# Patient Record
Sex: Male | Born: 1935
Health system: Southern US, Community
[De-identification: ages and names within clinical notes are randomized; demographics above are authoritative.]

## PROBLEM LIST (undated history)

## (undated) DIAGNOSIS — K264 Chronic or unspecified duodenal ulcer with hemorrhage: Secondary | ICD-10-CM

## (undated) DIAGNOSIS — K5730 Diverticulosis of large intestine without perforation or abscess without bleeding: Secondary | ICD-10-CM

## (undated) DIAGNOSIS — Z960 Presence of urogenital implants: Secondary | ICD-10-CM

## (undated) DIAGNOSIS — N400 Enlarged prostate without lower urinary tract symptoms: Secondary | ICD-10-CM

## (undated) DIAGNOSIS — N19 Unspecified kidney failure: Secondary | ICD-10-CM

## (undated) DIAGNOSIS — Z9289 Personal history of other medical treatment: Secondary | ICD-10-CM

## (undated) HISTORY — DX: Unspecified kidney failure: N19

## (undated) HISTORY — PX: COLONOSCOPY W/ POLYPECTOMY: SHX1380

## (undated) HISTORY — PX: HERNIA REPAIR: SHX51

## (undated) HISTORY — PX: TONSILLECTOMY: SUR1361

## (undated) HISTORY — DX: Diverticulosis of large intestine without perforation or abscess without bleeding: K57.30

---
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
## 2007-10-18 ENCOUNTER — Encounter: Admission: RE | Admit: 2007-10-18 | Discharge: 2007-10-18 | Payer: Self-pay | Admitting: Surgery

## 2007-10-18 IMAGING — CR DG CHEST 2V
2 series · 2 of 2 positions shown · non-contrast
Comparison: None.

CLINICAL DATA: Preoperative respiratory evaluation prior to right inguinal
hernia repair. Former smoker.

CHEST - 2 VIEW  [DATE]:

[w chest pa]
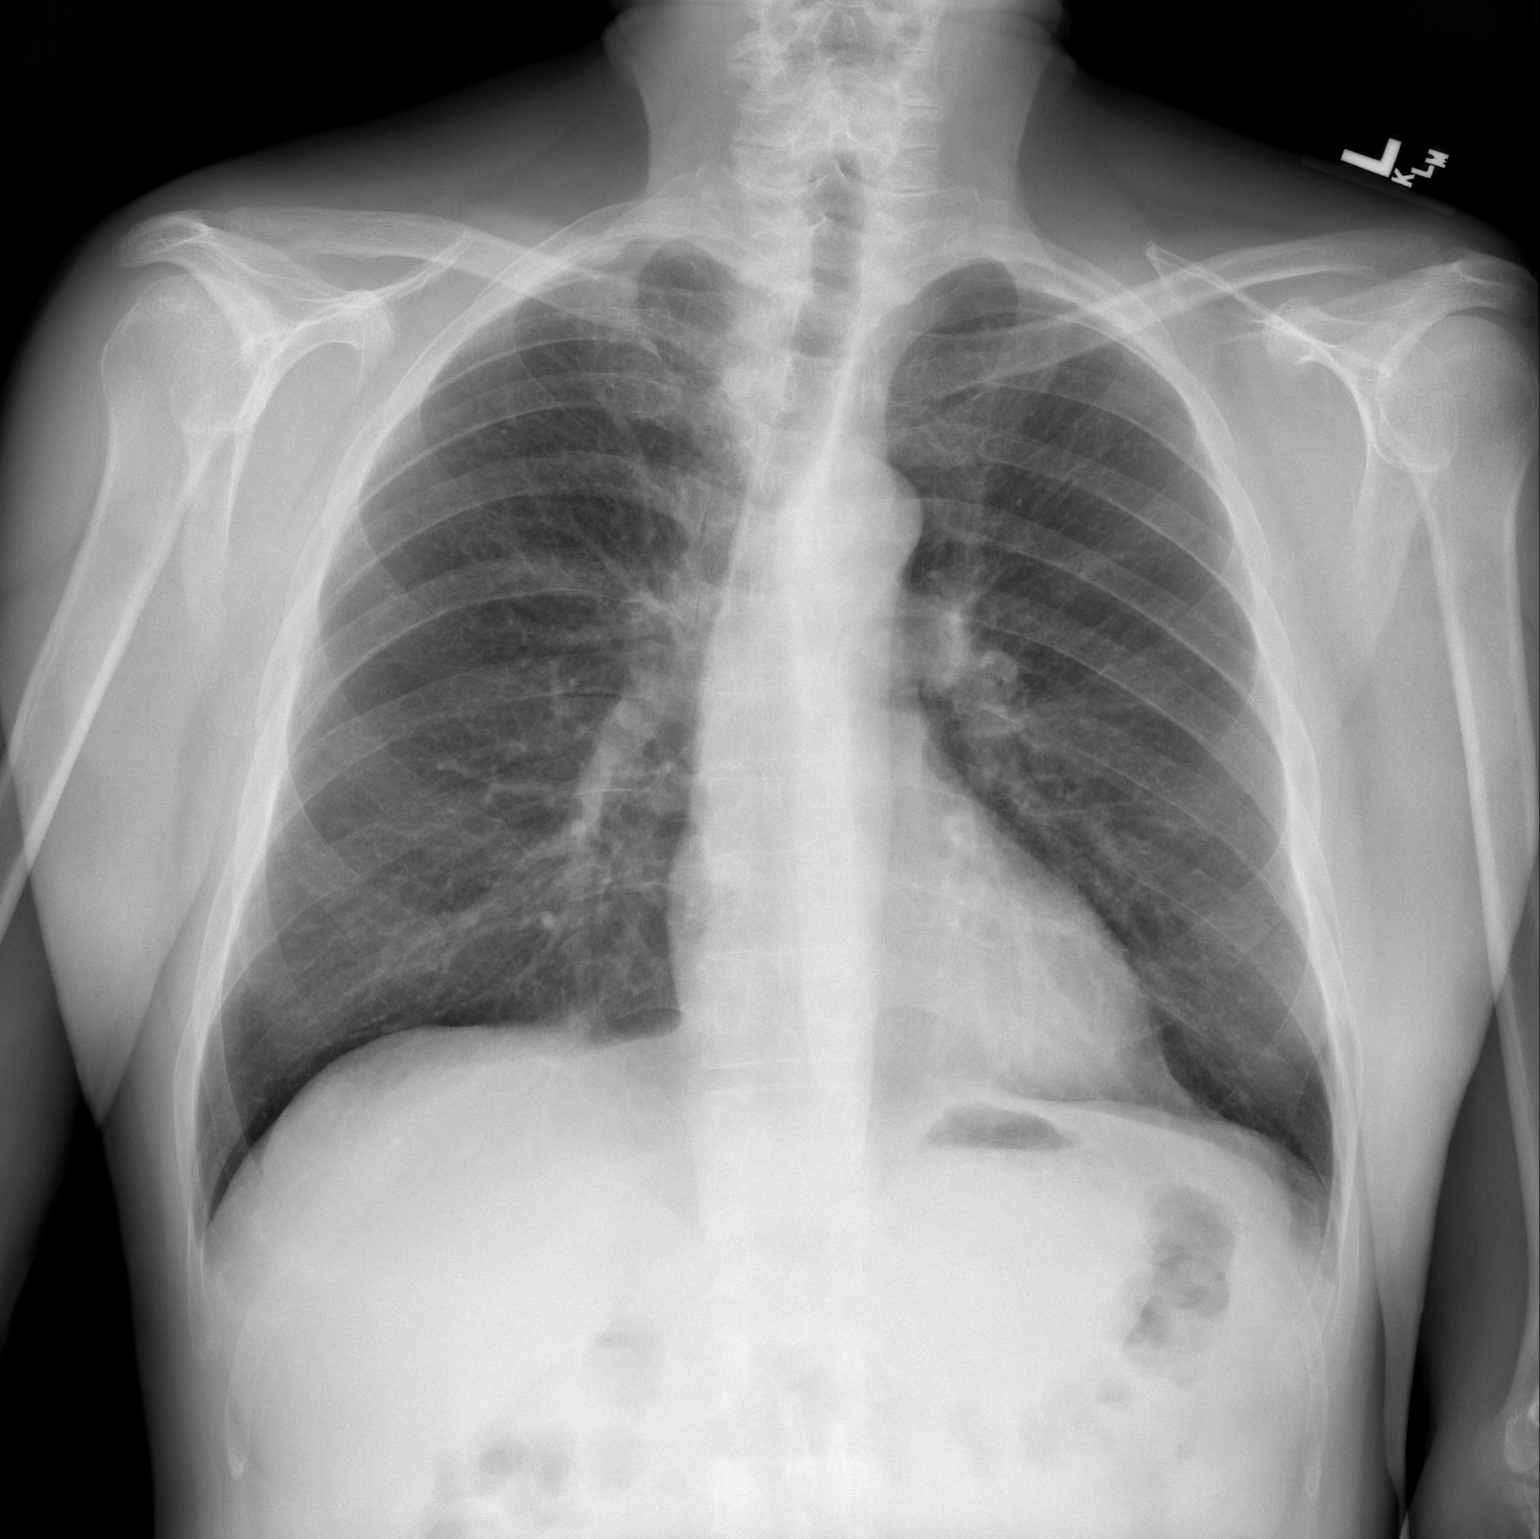

[w chest lat]
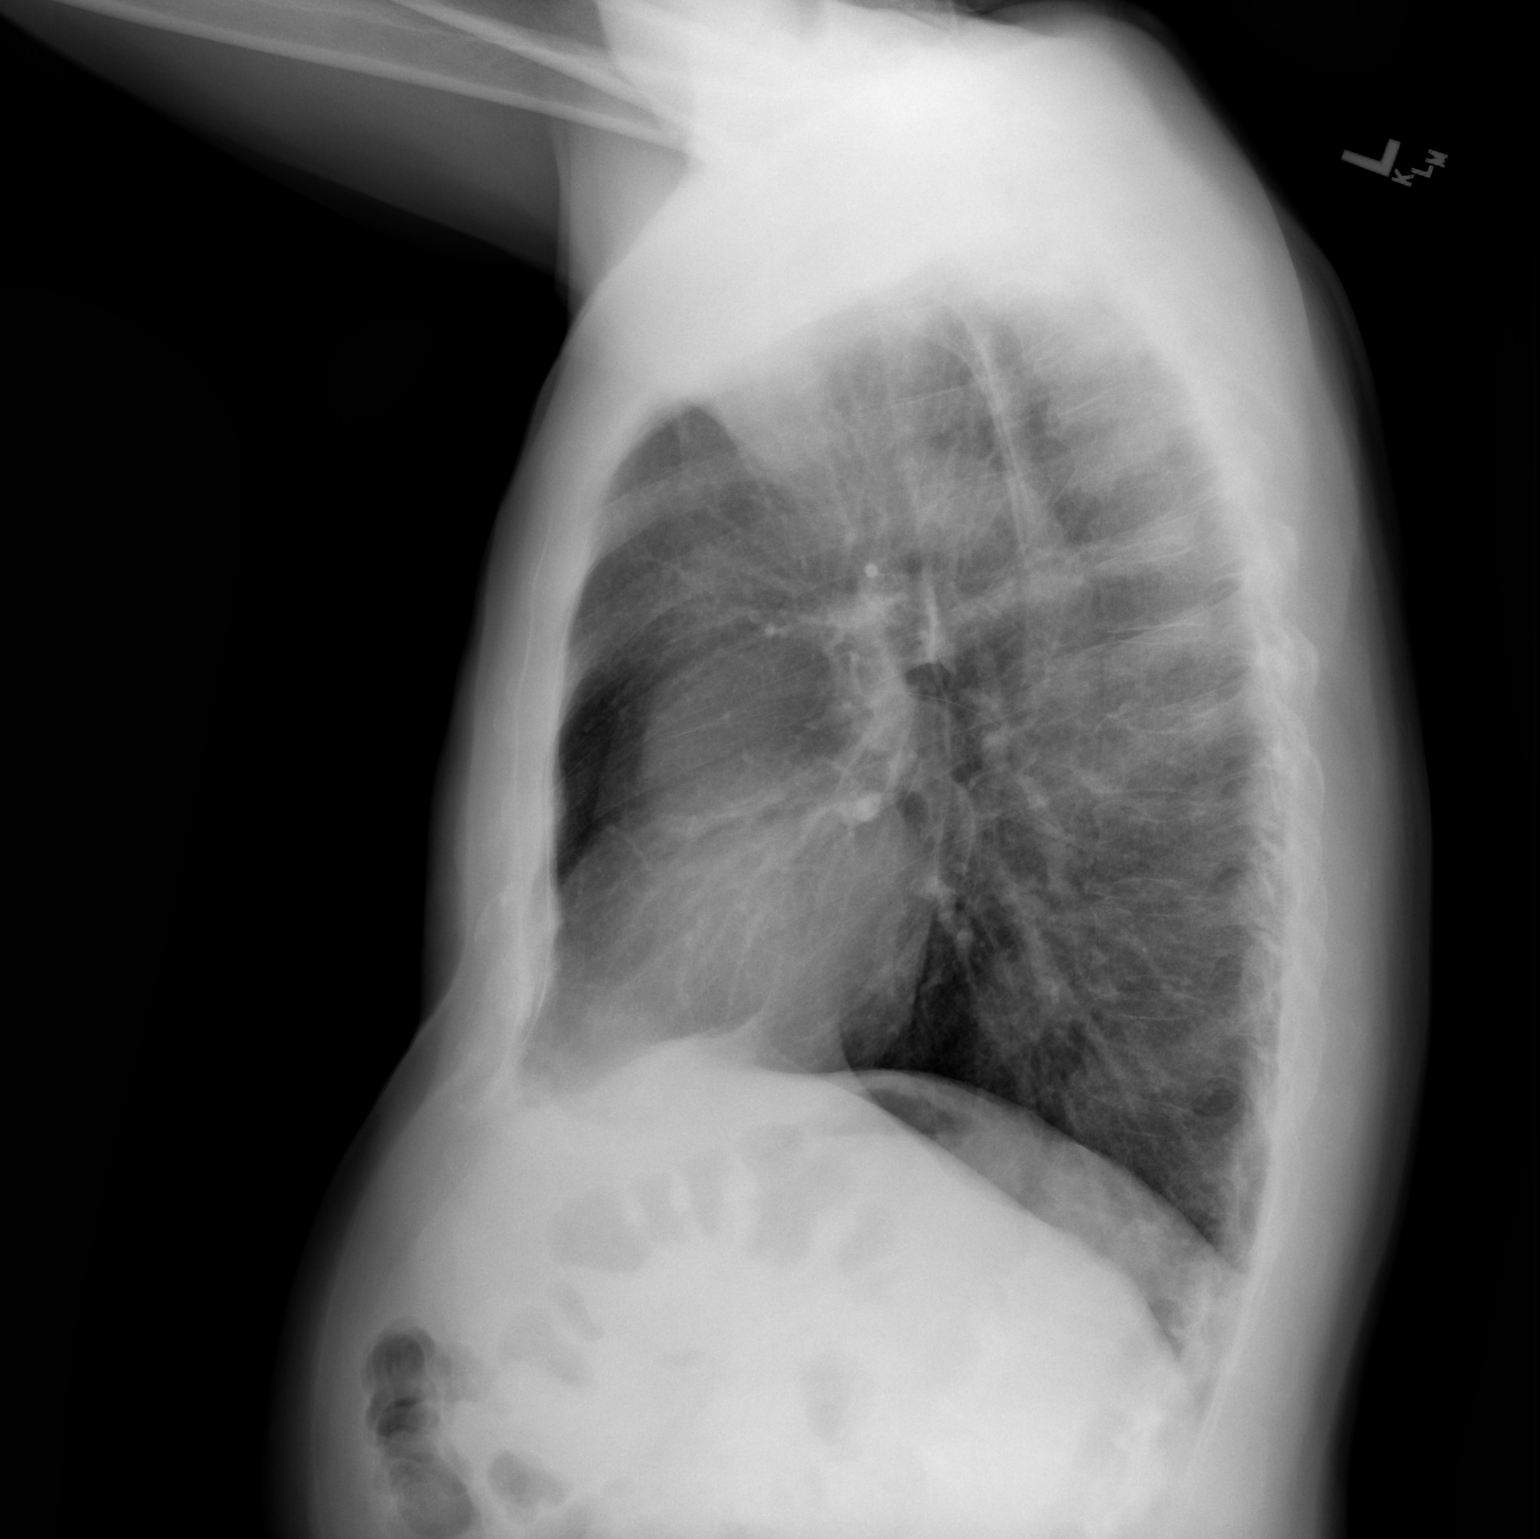

[2 of 2 positions shown; findings below may reference images not displayed]

FINDINGS: Cardiomediastinal silhouette unremarkable for age. Pulmonary
parenchyma clear. Pulmonary vascularity normal. No pleural effusions. Mild
degenerative changes in the thoracic spine.
IMPRESSION: No acute cardiopulmonary disease.

## 2007-10-21 ENCOUNTER — Ambulatory Visit (HOSPITAL_BASED_OUTPATIENT_CLINIC_OR_DEPARTMENT_OTHER): Admission: RE | Admit: 2007-10-21 | Discharge: 2007-10-21 | Payer: Self-pay | Admitting: Surgery

## 2009-07-27 ENCOUNTER — Encounter (INDEPENDENT_AMBULATORY_CARE_PROVIDER_SITE_OTHER): Payer: Self-pay | Admitting: *Deleted

## 2009-07-28 ENCOUNTER — Ambulatory Visit: Payer: Self-pay | Admitting: Internal Medicine

## 2009-08-17 ENCOUNTER — Ambulatory Visit: Payer: Self-pay | Admitting: Internal Medicine

## 2009-08-22 ENCOUNTER — Encounter: Payer: Self-pay | Admitting: Internal Medicine

## 2011-01-24 NOTE — Op Note (Signed)
[name], [name] NO.:  [id number]   MEDICAL RECORD NO.:  [id number]          PATIENT TYPE:  AMB   LOCATION:  [facility]                         FACILITY:  [facility]   PHYSICIAN:  Velora Heckler, MD      DATE OF BIRTH:  06-01-1932   DATE OF PROCEDURE:  10/21/2007  DATE OF DISCHARGE:                               OPERATIVE REPORT   PREOPERATIVE DIAGNOSIS:  Right inguinal hernia.   POSTOPERATIVE DIAGNOSIS:  Right inguinal hernia.   PROCEDURE:  Repair right inguinal hernia with Ethicon UltraPro mesh.   SURGEON:  Velora Heckler, MD, FACS   ANESTHESIA:  General per Quentin Cornwall. Council Mechanic, M.D.   ESTIMATED BLOOD LOSS:  Minimal.   PREPARATION:  Betadine.   COMPLICATIONS:  None.   INDICATIONS:  The patient is a 77 year old white male from [place],  [place].  He has a longstanding right inguinal hernia which has  gradually increased in size.  It is always been reducible.  He is  referred by Dr. Darvin Neighbours for repair.   BODY OF REPORT:  Procedure was done in OR number 4 at the [facility]  surgical center.  The patient is brought to the operating room, placed  in supine position on the operating room table.  Following  administration of general anesthesia the patient is prepped and draped  in usual strict aseptic fashion.  After ascertaining that an adequate  level of anesthesia been achieved, right inguinal incision was made with  #15 blade.  Dissection was carried down through subcutaneous tissues and  hemostasis obtained with electrocautery.  External oblique fascia is  incised in line with its fibers and extended through the external  inguinal ring.  Cord structures were dissected out, encircled with a  Penrose drain.  There is a large direct inguinal hernia.  This was  dissected away from the cord structures up to level the internal  inguinal ring.  Cord is explored.  There is no evidence of an indirect  hernia.  A direct hernia was reduced and the floor the  canal  reapproximated with interrupted 2-0 silk sutures.  Next a sheet of  Ethicon Ultra Pro mesh was cut to the appropriate dimensions.  It is  secured to the pubic tubercle along the inguinal ligament with a running  2-0 Novofil suture.  Mesh was split to accommodate the cord structures.  Superior margin of the mesh was secured to the transversalis and  internal oblique fascia with interrupted 2-0 Novofil sutures.  Tails of  mesh were overlapped lateral to the cord structures and secured to the  inguinal ligament with interrupted 2-0 Novofil sutures.  Local field  block is placed with Marcaine.  Cord structures were returned to the  inguinal canal.  External oblique fascia was closed with interrupted 3-0  Vicryl sutures.  Subcutaneous tissues were closed with interrupted 3-0  Vicryl sutures.  Skin was anesthetized with local anesthetic.  Skin was  closed with running 4-0 Monocryl subcuticular  suture.  Wound is washed and dried and Benzoin Steri-Strips were  applied.  Sterile dressings were applied.  The patient is awakened from  anesthesia and brought to the recovery room in stable condition.  The  patient tolerated the procedure well.      Velora Heckler, MD  Electronically Signed     TMG/MEDQ  D:  10/21/2007  T:  10/22/2007  Job:  [id number]   cc:   Windy Fast L. Earlene Plater, M.D.  Fax: [phone]   Chales Salmon. Abigail Miyamoto, M.D.  Fax: [phone]

## 2011-06-02 LAB — PROTIME-INR
INR: 1.1
Prothrombin Time: 13.9

## 2011-06-02 LAB — BASIC METABOLIC PANEL
CO2: 30
Calcium: 9.5
Creatinine, Ser: 1.01
GFR calc Af Amer: >60
GFR calc non Af Amer: >60
Glucose, Bld: 165 — ABNORMAL HIGH
Sodium: 139

## 2011-06-02 LAB — URINALYSIS, ROUTINE W REFLEX MICROSCOPIC
Nitrite: NEGATIVE
Protein, ur: NEGATIVE
Urobilinogen, UA: 1

## 2011-06-02 LAB — CBC
Hemoglobin: 15.4
MCHC: 34.2
RDW: 13.8

## 2011-06-02 LAB — DIFFERENTIAL
Basophils Absolute: 0.1
Basophils Relative: 2 — ABNORMAL HIGH
Lymphocytes Relative: 27
Neutro Abs: 4.1
Neutrophils Relative %: 58

## 2013-03-17 ENCOUNTER — Encounter (HOSPITAL_BASED_OUTPATIENT_CLINIC_OR_DEPARTMENT_OTHER): Payer: Self-pay | Admitting: *Deleted

## 2013-03-17 ENCOUNTER — Emergency Department (HOSPITAL_BASED_OUTPATIENT_CLINIC_OR_DEPARTMENT_OTHER): Payer: Medicare Other

## 2013-03-17 ENCOUNTER — Inpatient Hospital Stay (HOSPITAL_BASED_OUTPATIENT_CLINIC_OR_DEPARTMENT_OTHER)
Admission: EM | Admit: 2013-03-17 | Discharge: 2013-03-19 | DRG: 726 | Disposition: A | Discharge status: Home / Self Care | Payer: Medicare Other | Attending: Internal Medicine | Admitting: Internal Medicine

## 2013-03-17 DIAGNOSIS — N179 Acute kidney failure, unspecified: Secondary | ICD-10-CM | POA: Diagnosis present

## 2013-03-17 DIAGNOSIS — N401 Enlarged prostate with lower urinary tract symptoms: Principal | ICD-10-CM | POA: Diagnosis present

## 2013-03-17 DIAGNOSIS — R339 Retention of urine, unspecified: Secondary | ICD-10-CM | POA: Diagnosis present

## 2013-03-17 DIAGNOSIS — Z79899 Other long term (current) drug therapy: Secondary | ICD-10-CM

## 2013-03-17 DIAGNOSIS — R319 Hematuria, unspecified: Secondary | ICD-10-CM | POA: Diagnosis present

## 2013-03-17 DIAGNOSIS — F1020 Alcohol dependence, uncomplicated: Secondary | ICD-10-CM | POA: Diagnosis present

## 2013-03-17 DIAGNOSIS — N19 Unspecified kidney failure: Secondary | ICD-10-CM

## 2013-03-17 DIAGNOSIS — N139 Obstructive and reflux uropathy, unspecified: Secondary | ICD-10-CM

## 2013-03-17 DIAGNOSIS — N138 Other obstructive and reflux uropathy: Principal | ICD-10-CM | POA: Diagnosis present

## 2013-03-17 DIAGNOSIS — N320 Bladder-neck obstruction: Secondary | ICD-10-CM | POA: Diagnosis present

## 2013-03-17 DIAGNOSIS — D696 Thrombocytopenia, unspecified: Secondary | ICD-10-CM | POA: Diagnosis present

## 2013-03-17 LAB — BASIC METABOLIC PANEL
BUN: 40 mg/dL — ABNORMAL HIGH (ref 6–23)
Calcium: 9.3 mg/dL (ref 8.4–10.5)
Chloride: 94 mEq/L — ABNORMAL LOW (ref 96–112)
Creatinine, Ser: 3.9 mg/dL — ABNORMAL HIGH (ref 0.50–1.35)
GFR calc Af Amer: 16 mL/min — ABNORMAL LOW (ref >90)

## 2013-03-17 LAB — URINALYSIS, ROUTINE W REFLEX MICROSCOPIC
Bilirubin Urine: NEGATIVE
Ketones, ur: NEGATIVE mg/dL
Nitrite: NEGATIVE
Protein, ur: NEGATIVE mg/dL
Urobilinogen, UA: 0.2 mg/dL (ref 0.0–1.0)
pH: 5.5 (ref 5.0–8.0)

## 2013-03-17 LAB — CBC
HCT: 38.1 % — ABNORMAL LOW (ref 39.0–52.0)
MCHC: 35.7 g/dL (ref 30.0–36.0)
Platelets: 142 10*3/uL — ABNORMAL LOW (ref 150–400)
RDW: 13.5 % (ref 11.5–15.5)

## 2013-03-17 LAB — URINE MICROSCOPIC-ADD ON

## 2013-03-17 IMAGING — US US RENAL
1 series · 14 of 25 positions shown · non-contrast
Comparison: None.

CLINICAL DATA: Urinary tension.

RENAL/URINARY TRACT ULTRASOUND COMPLETE

[Series 1: us renal · 0.17mm/px · 14 of 29 slices shown]
[im 1/29]
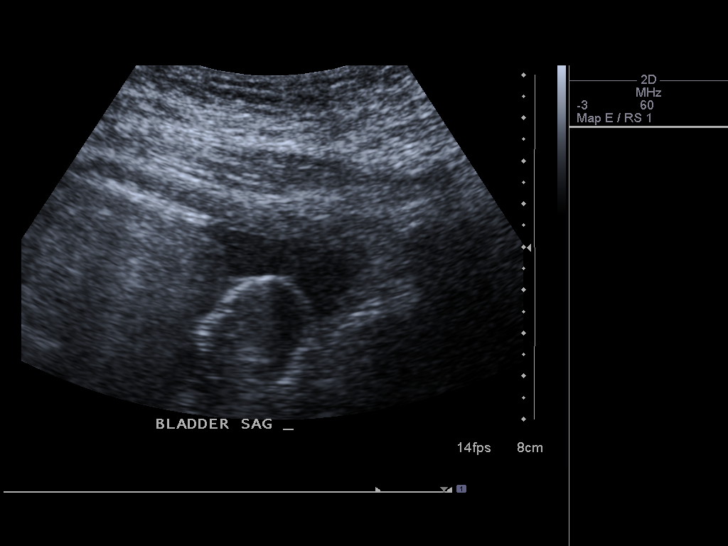
[im 3/29]
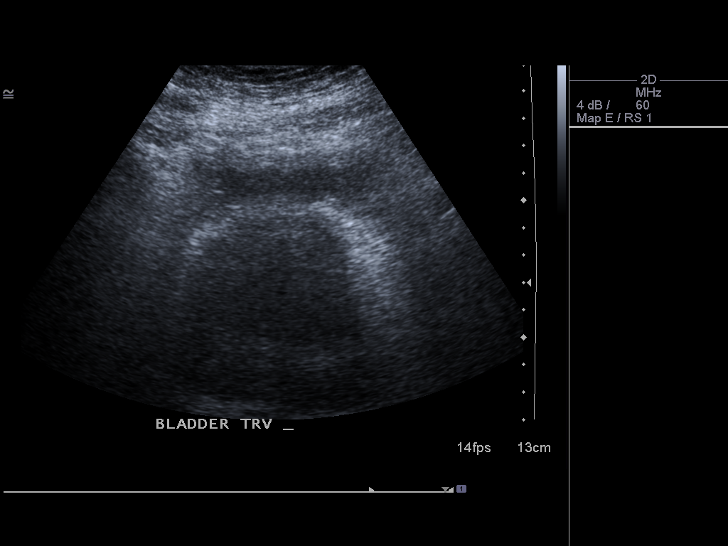
[im 5/29]
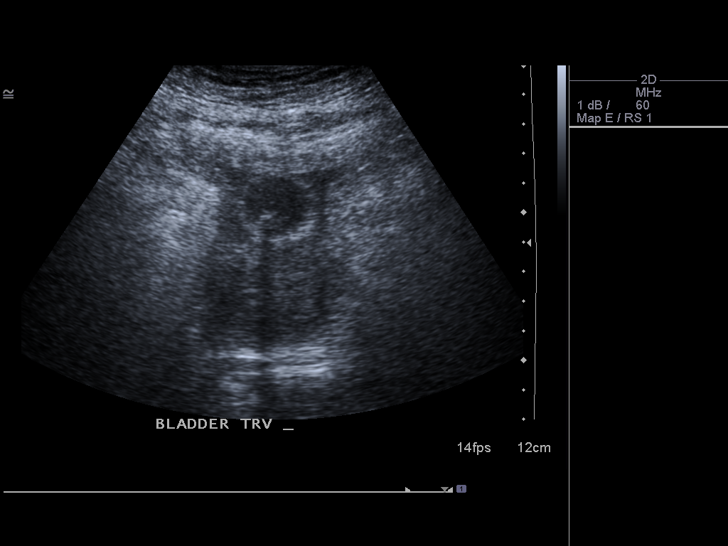
[im 8/29]
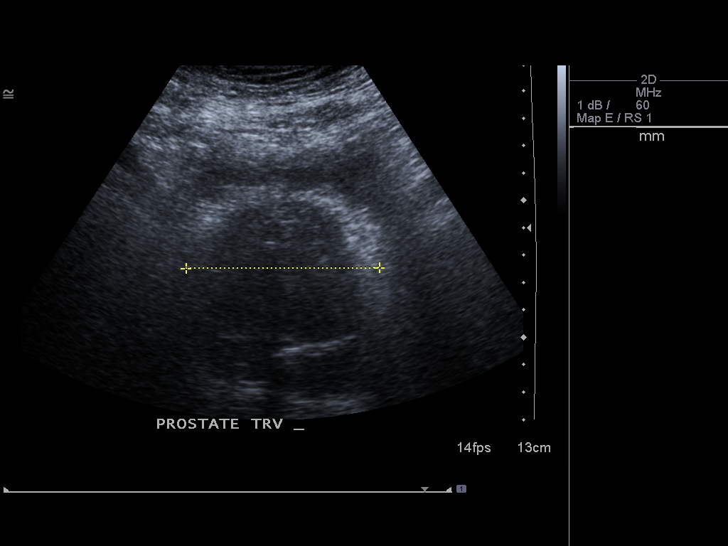
[im 10/29]
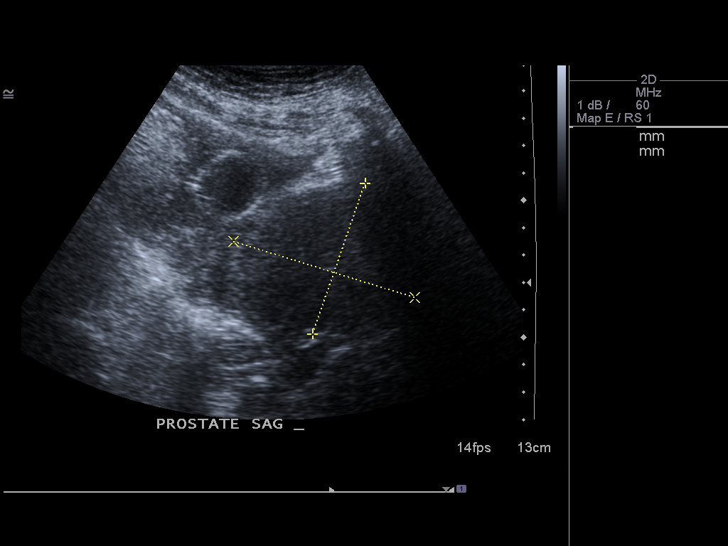
[im 11/29]
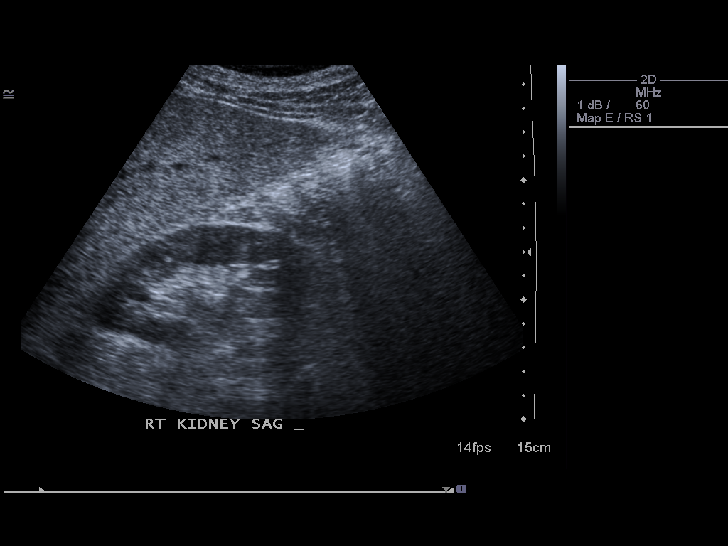
[im 13/29]
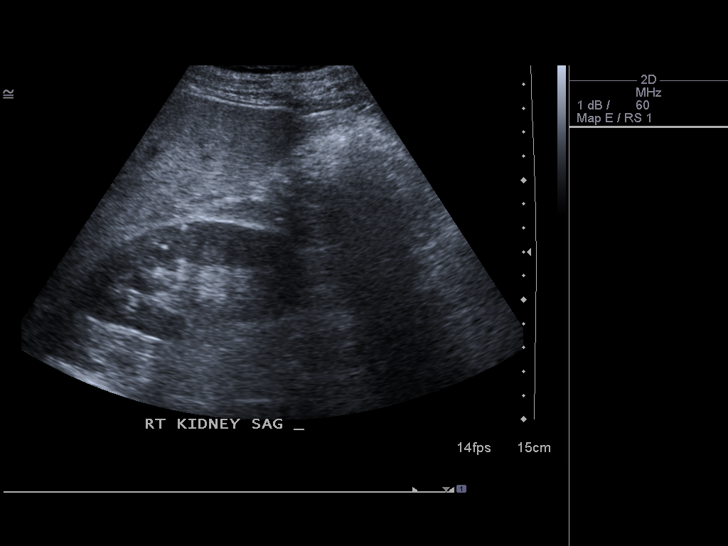
[im 16/29]
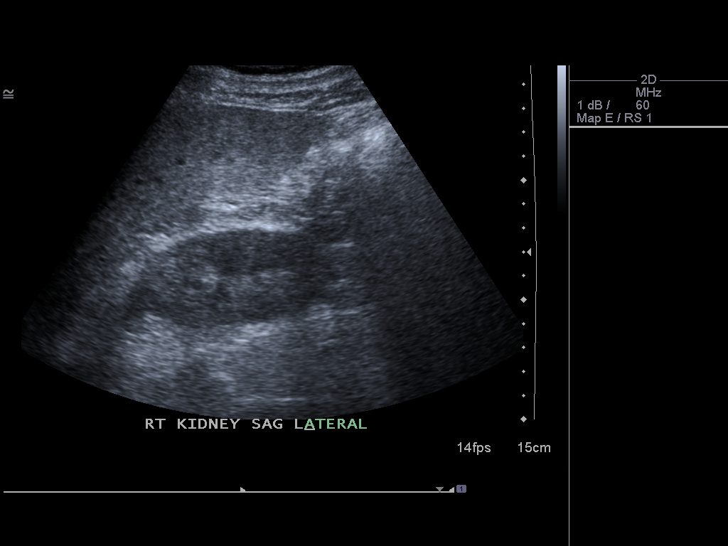
[im 18/29]
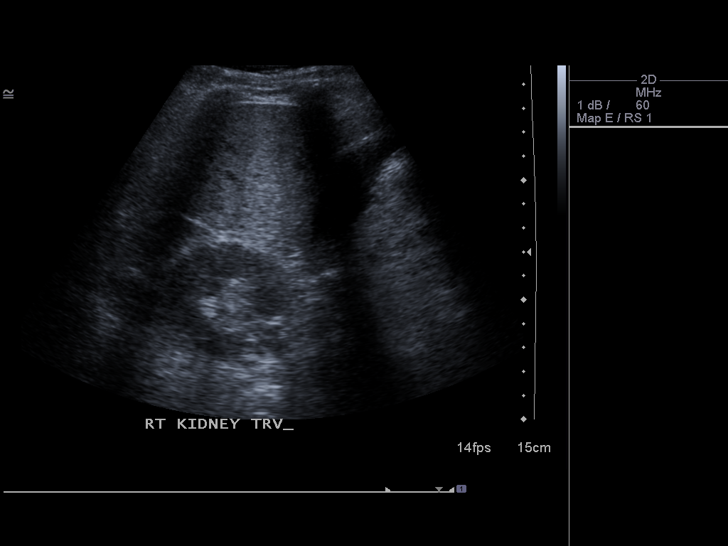
[im 19/29]
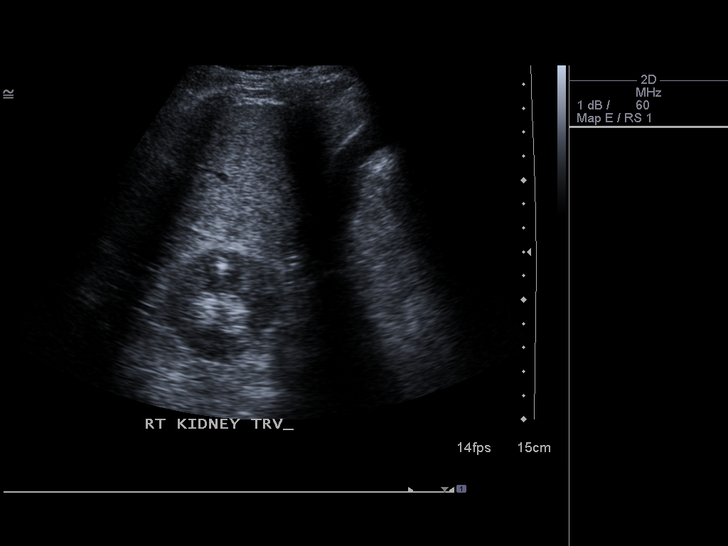
[im 22/29]
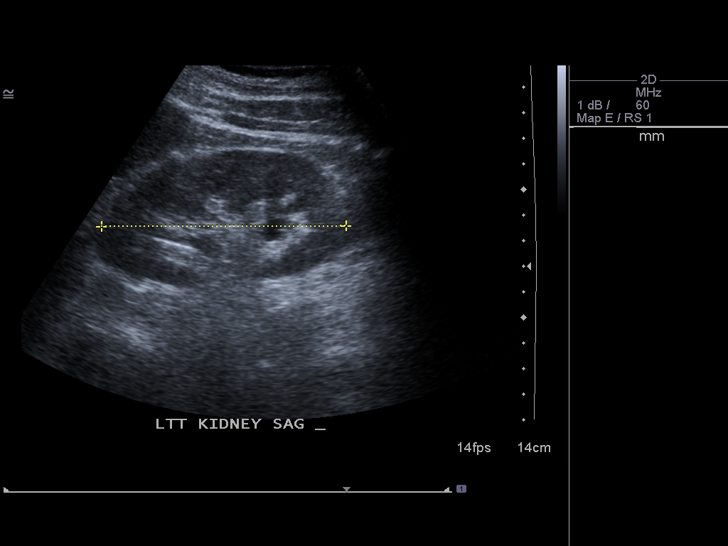
[im 24/29]
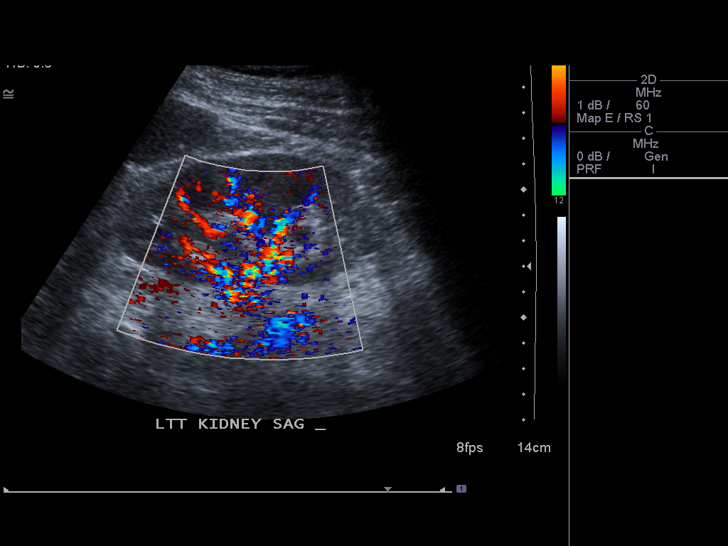
[im 26/29]
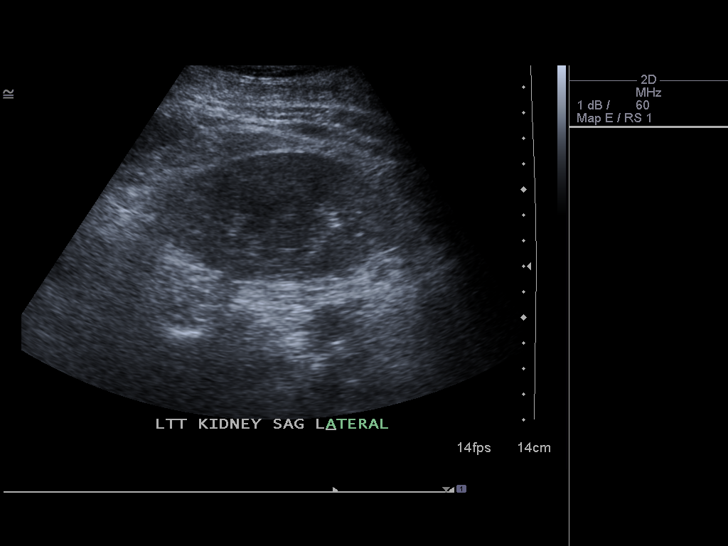
[im 29/29]
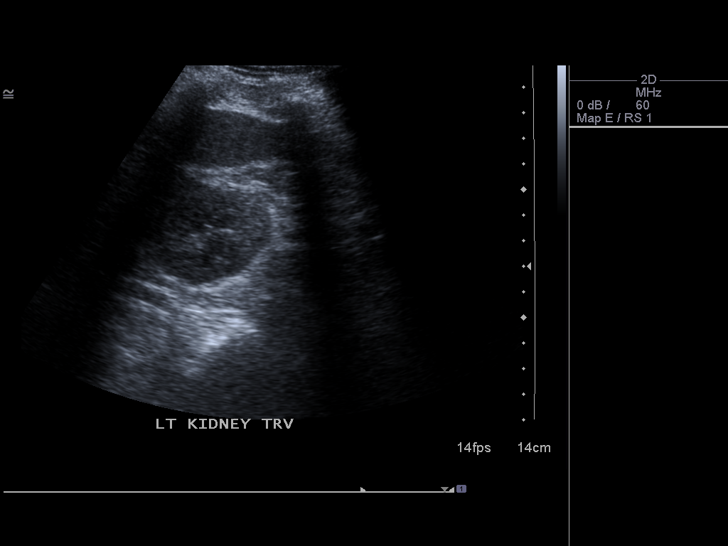

[14 of 25 positions shown; findings below may reference images not displayed]

FINDINGS: Right Kidney:  9.2 cm.  Normal echotexture.  No focal abnormality
or hydronephrosis.

Left Kidney:  9.6 cm.  Normal echotexture.  No focal abnormality or
hydronephrosis.

Bladder:  Foley catheter in place.  Urinary bladder partially
decompressed, grossly unremarkable.  Prostate is enlarged.
IMPRESSION: Enlarged prostate.  No hydronephrosis.

## 2013-03-17 MED ORDER — LIDOCAINE HCL 2 % EX GEL
CUTANEOUS | Status: AC
  Filled 2013-03-17: qty 20

## 2013-03-17 MED ORDER — LIDOCAINE HCL 2 % EX GEL
Freq: Once | CUTANEOUS | Status: AC
  Administered 2013-03-17: 22:00:00 via URETHRAL

## 2013-03-17 MED ORDER — SODIUM CHLORIDE 0.9 % IV BOLUS (SEPSIS)
1000.0000 mL | Freq: Once | INTRAVENOUS | Status: AC
  Administered 2013-03-17: 1000 mL via INTRAVENOUS

## 2013-03-17 NOTE — ED Notes (Signed)
Urinary retention x 2 days. Dribbling urine.

## 2013-03-17 NOTE — ED Provider Notes (Signed)
History  This chart was scribed for Ethelda Chick, MD by Ardeen Jourdain, ED Scribe. This patient was seen in room MH10/MH10 and the patient's care was started at 2134.  CSN: [id number] Arrival date & time 03/17/13  2058   First MD Initiated Contact with Patient 03/17/13 2134     Chief Complaint  Patient presents with  . Urinary Retention    The history is provided by the patient. No language interpreter was used.    HPI Comments: Glenn Mendez is a 79 y.o. male who presents to the Emergency Department complaining of gradual onset, gradually worsening, constant urinary retention that began 2 days ago. He denies any pain. He states he had a full feeling. He reports being able to "dribble urine." when he tried to urinate. He states he has a h/o enlarged prostate. Pt states he was evaluated by his PCP who prescribed Flomax and ciprofloxacin. He states he has been taking the medication as directed.   History reviewed. No pertinent past medical history. Past Surgical History  Procedure Laterality Date  . Tonsillectomy    . Hernia repair     Family History  Problem Relation Age of Onset  . Stroke Father    History  Substance Use Topics  . Smoking status: Never Smoker   . Smokeless tobacco: Not on file  . Alcohol Use: 19.2 oz/week    32 Shots of liquor per week     Comment: 8oz of burbon each night and has done this for years.    Review of Systems  Genitourinary: Positive for difficulty urinating.  All other systems reviewed and are negative.    Allergies  Review of patient's allergies indicates no known allergies.  Home Medications   No current outpatient prescriptions on file. Triage Vitals: BP 143/83  Pulse 70  Temp(Src) 98.8 F (37.1 C) (Oral)  Resp 20  Wt 145 lb (65.772 kg)  SpO2 98%  Physical Exam  Nursing note and vitals reviewed. Constitutional: He is oriented to person, place, and time. He appears well-developed and well-nourished. No distress.  HENT:   Head: Normocephalic and atraumatic.  Eyes: EOM are normal. Pupils are equal, round, and reactive to light.  Neck: Normal range of motion. Neck supple. No tracheal deviation present.  Cardiovascular: Normal rate.   Pulmonary/Chest: Effort normal. No respiratory distress.  Abdominal: Soft. Bowel sounds are normal. He exhibits no distension and no mass. There is no tenderness. There is no rebound and no guarding.  Genitourinary:  Foley catheter in place draining clear yellow urine   Musculoskeletal: Normal range of motion. He exhibits no edema.  Neurological: He is alert and oriented to person, place, and time.  Skin: Skin is warm and dry.  Psychiatric: He has a normal mood and affect. His behavior is normal.    ED Course  Procedures (including critical care time)  DIAGNOSTIC STUDIES: Oxygen Saturation is 98% on room air, normal by my interpretation.    COORDINATION OF CARE:  9:49 PM-Discussed treatment plan which includes catheter placement, urine culture, BMP, CBC and UA with pt at bedside and pt agreed to plan.   11:43 PM d/w Dr. Margarita Grizzle, urology- he requests hospitalist admission.  D/w Dr. Toniann Fail, pt to go to [facility], team 8, telemetry bed.  Pt and wife updated at bedside about findings and plan.  He is getting renal ultrasound here prior to transfer.  Foley initially drained 1500cc- clamped, then drained approx 900 cc.  Labs Reviewed  URINALYSIS, ROUTINE W REFLEX  MICROSCOPIC - Abnormal; Notable for the following:    Hgb urine dipstick LARGE (*)    All other components within normal limits  BASIC METABOLIC PANEL - Abnormal; Notable for the following:    Sodium 130 (*)    Chloride 94 (*)    Glucose, Bld 145 (*)    BUN 40 (*)    Creatinine, Ser 3.90 (*)    GFR calc non Af Amer 13 (*)    GFR calc Af Amer 16 (*)    All other components within normal limits  CBC - Abnormal; Notable for the following:    WBC 11.7 (*)    RBC 4.20 (*)    HCT 38.1 (*)    Platelets 142  (*)    All other components within normal limits  COMPREHENSIVE METABOLIC PANEL - Abnormal; Notable for the following:    Glucose, Bld 120 (*)    BUN 28 (*)    Creatinine, Ser 2.09 (*)    Albumin 3.1 (*)    GFR calc non Af Amer 29 (*)    GFR calc Af Amer 33 (*)    All other components within normal limits  CBC WITH DIFFERENTIAL - Abnormal; Notable for the following:    RBC 4.19 (*)    HCT 38.2 (*)    Platelets 142 (*)    Lymphocytes Relative 10 (*)    Monocytes Relative 13 (*)    Monocytes Absolute 1.1 (*)    All other components within normal limits  URINE CULTURE  URINE CULTURE  URINE MICROSCOPIC-ADD ON  BASIC METABOLIC PANEL   US Renal  03/17/2013   *RADIOLOGY REPORT*  Clinical Data: Urinary tension.  RENAL/URINARY TRACT ULTRASOUND COMPLETE  Comparison:  None.  Findings:  Right Kidney:  9.2 cm.  Normal echotexture.  No focal abnormality or hydronephrosis.  Left Kidney:  9.6 cm.  Normal echotexture.  No focal abnormality or hydronephrosis.  Bladder:  Foley catheter in place.  Urinary bladder partially decompressed, grossly unremarkable.  Prostate is enlarged.  IMPRESSION: Enlarged prostate.  No hydronephrosis.   Original Report Authenticated By: Charlett Nose, M.D.   1. Renal failure   2. Urinary obstruction   3. Acute renal failure     MDM  Pt presenting with urinary retention- foley catheter placed and in total drained approx 2400cc- pt found to be in acute renal failure.  No hx of this prior. D/w urology and pt to be admitted to triad, [place].   Pt and wife updated at the bedside about findings and plan for admission.    I personally performed the services described in this documentation, which was scribed in my presence. The recorded information has been reviewed and is accurate.    Ethelda Chick, MD 03/18/13 [phone]

## 2013-03-17 NOTE — ED Notes (Signed)
Pt. Urine has started to turn pink in color.

## 2013-03-18 ENCOUNTER — Encounter (HOSPITAL_BASED_OUTPATIENT_CLINIC_OR_DEPARTMENT_OTHER): Payer: Self-pay | Admitting: *Deleted

## 2013-03-18 DIAGNOSIS — N19 Unspecified kidney failure: Secondary | ICD-10-CM

## 2013-03-18 DIAGNOSIS — N139 Obstructive and reflux uropathy, unspecified: Secondary | ICD-10-CM

## 2013-03-18 DIAGNOSIS — N179 Acute kidney failure, unspecified: Secondary | ICD-10-CM

## 2013-03-18 LAB — COMPREHENSIVE METABOLIC PANEL
ALT: 16 U/L (ref 0–53)
AST: 24 U/L (ref 0–37)
Albumin: 3.1 g/dL — ABNORMAL LOW (ref 3.5–5.2)
Alkaline Phosphatase: 45 U/L (ref 39–117)
Chloride: 106 mEq/L (ref 96–112)
Potassium: 3.5 mEq/L (ref 3.5–5.1)
Sodium: 138 mEq/L (ref 135–145)
Total Bilirubin: 0.7 mg/dL (ref 0.3–1.2)
Total Protein: 6.3 g/dL (ref 6.0–8.3)

## 2013-03-18 LAB — CBC WITH DIFFERENTIAL/PLATELET
Basophils Relative: 0 % (ref 0–1)
Eosinophils Absolute: 0.1 10*3/uL (ref 0.0–0.7)
Eosinophils Relative: 1 % (ref 0–5)
HCT: 38.2 % — ABNORMAL LOW (ref 39.0–52.0)
Lymphocytes Relative: 10 % — ABNORMAL LOW (ref 12–46)
MCH: 31.3 pg (ref 26.0–34.0)
MCV: 91.2 fL (ref 78.0–100.0)
Monocytes Absolute: 1.1 10*3/uL — ABNORMAL HIGH (ref 0.1–1.0)
RDW: 13.6 % (ref 11.5–15.5)
WBC: 8.2 10*3/uL (ref 4.0–10.5)

## 2013-03-18 MED ORDER — SODIUM CHLORIDE 0.9 % IJ SOLN
3.0000 mL | Freq: Two times a day (BID) | INTRAMUSCULAR | Status: DC
  Administered 2013-03-18: 3 mL via INTRAVENOUS

## 2013-03-18 MED ORDER — LORAZEPAM 1 MG PO TABS: 0.0000 mg | ORAL_TABLET | Freq: Two times a day (BID) | ORAL | Status: DC

## 2013-03-18 MED ORDER — ADULT MULTIVITAMIN W/MINERALS CH
1.0000 | ORAL_TABLET | Freq: Every day | ORAL | Status: DC
  Administered 2013-03-18 – 2013-03-19 (×2): 1 via ORAL
  Filled 2013-03-18 (×2): qty 1

## 2013-03-18 MED ORDER — VITAMIN B-1 100 MG PO TABS
100.0000 mg | ORAL_TABLET | Freq: Every day | ORAL | Status: DC
  Administered 2013-03-18 – 2013-03-19 (×2): 100 mg via ORAL
  Filled 2013-03-18 (×2): qty 1

## 2013-03-18 MED ORDER — ONDANSETRON HCL 4 MG/2ML IJ SOLN: 4.0000 mg | Freq: Four times a day (QID) | INTRAMUSCULAR | Status: DC | PRN

## 2013-03-18 MED ORDER — SODIUM CHLORIDE 0.9 % IV SOLN
INTRAVENOUS | Status: AC
  Administered 2013-03-18 (×2): via INTRAVENOUS

## 2013-03-18 MED ORDER — TAMSULOSIN HCL 0.4 MG PO CAPS
0.4000 mg | ORAL_CAPSULE | Freq: Every day | ORAL | Status: DC
  Administered 2013-03-18 – 2013-03-19 (×2): 0.4 mg via ORAL
  Filled 2013-03-18 (×2): qty 1

## 2013-03-18 MED ORDER — THIAMINE HCL 100 MG/ML IJ SOLN
100.0000 mg | Freq: Every day | INTRAMUSCULAR | Status: DC
  Filled 2013-03-18 (×2): qty 1

## 2013-03-18 MED ORDER — LORAZEPAM 1 MG PO TABS: 0.0000 mg | ORAL_TABLET | Freq: Four times a day (QID) | ORAL | Status: DC

## 2013-03-18 MED ORDER — CIPROFLOXACIN IN D5W 400 MG/200ML IV SOLN
400.0000 mg | INTRAVENOUS | Status: DC
  Administered 2013-03-18: 400 mg via INTRAVENOUS
  Filled 2013-03-18: qty 200

## 2013-03-18 MED ORDER — LORAZEPAM 1 MG PO TABS: 1.0000 mg | ORAL_TABLET | Freq: Four times a day (QID) | ORAL | Status: DC | PRN

## 2013-03-18 MED ORDER — FOLIC ACID 1 MG PO TABS
1.0000 mg | ORAL_TABLET | Freq: Every day | ORAL | Status: DC
  Filled 2013-03-18: qty 1

## 2013-03-18 MED ORDER — ACETAMINOPHEN 325 MG PO TABS: 650.0000 mg | ORAL_TABLET | Freq: Four times a day (QID) | ORAL | Status: DC | PRN

## 2013-03-18 MED ORDER — ACETAMINOPHEN 650 MG RE SUPP: 650.0000 mg | Freq: Four times a day (QID) | RECTAL | Status: DC | PRN

## 2013-03-18 MED ORDER — LORAZEPAM 2 MG/ML IJ SOLN: 1.0000 mg | Freq: Four times a day (QID) | INTRAMUSCULAR | Status: DC | PRN

## 2013-03-18 MED ORDER — HYDROCORTISONE 2.5 % RE CREA
TOPICAL_CREAM | Freq: Four times a day (QID) | RECTAL | Status: DC
  Administered 2013-03-19: via TOPICAL
  Filled 2013-03-18: qty 28.35

## 2013-03-18 MED ORDER — ONDANSETRON HCL 4 MG PO TABS: 4.0000 mg | ORAL_TABLET | Freq: Four times a day (QID) | ORAL | Status: DC | PRN

## 2013-03-18 MED ORDER — FOLIC ACID 1 MG PO TABS
1.0000 mg | ORAL_TABLET | Freq: Every day | ORAL | Status: DC
  Administered 2013-03-18 – 2013-03-19 (×2): 1 mg via ORAL
  Filled 2013-03-18 (×2): qty 1

## 2013-03-18 NOTE — ED Notes (Signed)
Reported to [employer] about Pt. Having a drink of burbon each night.

## 2013-03-18 NOTE — Consult Note (Signed)
Urology Consult   Physician requesting consult: Dhungel  Reason for consult: Urinary retention  History of Present Illness: Glenn Mendez is a 79 y.o. male with PMH significant for BPH and elevated PSAs who presented to the ED Monday night complaining of an inability to urinate.  He states he first noted a problem Sunday morning when he woke up and was only able to "dribble".  He could not generate a normal urine stream.  He did not have lower abdominal pain, hematuria, or dysuria.  This persisted into Monday at which time he was evaled by his PCP in the office.  He was told he had prostatitis and given scripts for Cipro and Flomax.  He took one dose of each on Monday.  He spoke with his son who is a neurologist Monday afternoon and his son advised him to proceed to the ED for eval and possible cath placement.  A cath was placed in the ED with 1500cc return and his Cr was noted to be elevated at 3.90.  UA shows no infection and culture is pending. RUS revealed an enlarged prostate with no hydronephrosis.  There was blood noted on UA, however, this was likely due to foley insertion.  Pt was admitted for observation of renal function and gentle hydration.   Pt states that he has never had an episode like this in the past.  He denies a history of voiding or storage urinary symptoms, hematuria, UTIs, STDs, urolithiasis, GU malignancy/trauma/surgery.  He has a known hx of an enlarged prostate and significantly elevated PSAs for many years.  Review of PSAs from our office range from 20-30.  He had one negative prostate biopsy in the late 1990s and has not had it repeated.  He was followed by Dr. Earlene Plater for this until 2010 at which time he returned his PSA checks to his PCP.  His PSA was checked last August and he thinks is was around 20.    He is currently resting comfortably and is without complaint.  He denies F/C, CP, SOB, N/V, and abdominal pain.  Foley is in place.  His Cr has dropped to 2.09.  Past  medical history: BPH Elevated PSA  Past Surgical History  Procedure Laterality Date  . Tonsillectomy    . Hernia repair      Current Hospital Medications:  Home Meds:    Medication List    ASK your doctor about these medications       aspirin 81 MG tablet  Take 81 mg by mouth daily.     ciprofloxacin 500 MG tablet  Commonly known as:  CIPRO  Take 500 mg by mouth 2 (two) times daily. 10 day course     folic acid 1 MG tablet  Commonly known as:  FOLVITE  Take 1 mg by mouth daily.     tamsulosin 0.4 MG Caps  Commonly known as:  FLOMAX  Take 0.4 mg by mouth daily.        Scheduled Meds: . folic acid  1 mg Oral Daily  . LORazepam  0-4 mg Oral Q6H   Followed by  . [START ON 03/20/2013] LORazepam  0-4 mg Oral Q12H  . multivitamin with minerals  1 tablet Oral Daily  . sodium chloride  3 mL Intravenous Q12H  . thiamine  100 mg Oral Daily   Or  . thiamine  100 mg Intravenous Daily   Continuous Infusions: . sodium chloride 75 mL/hr at 03/18/13 0417   PRN Meds:.acetaminophen, acetaminophen, LORazepam,  LORazepam, ondansetron (ZOFRAN) IV, ondansetron  Allergies: No Known Allergies  Family History  Problem Relation Age of Onset  . Stroke Father     Social History:  reports that he has never smoked. He does not have any smokeless tobacco history on file. He reports that he drinks about 19.2 ounces of alcohol per week. He reports that he does not use illicit drugs.  ROS: A complete review of systems was performed.  All systems are negative except for pertinent findings as noted.  Physical Exam:  Vital signs in last 24 hours: Temp:  [98.2 F (36.8 C)-99.5 F (37.5 C)] 99.5 F (37.5 C) (07/08 0600) Pulse Rate:  [70-76] 73 (07/08 1100) Resp:  [18-20] 18 (07/08 0600) BP: (120-149)/(63-83) 120/63 mmHg (07/08 0600) SpO2:  [94 %-98 %] 94 % (07/08 0600) Weight:  [65.772 kg (145 lb)-67.1 kg (147 lb 14.9 oz)] 67.1 kg (147 lb 14.9 oz) (07/08 0122) Constitutional:  Alert  and oriented, No acute distress Cardiovascular: Regular rate and rhythm, No JVD Respiratory: Normal respiratory effort, Lungs clear bilaterally GI: Abdomen is soft, nontender, nondistended, no abdominal masses GU: No CVA tenderness; foley in place with clear/yellow urine Lymphatic: No lymphadenopathy Neurologic: Grossly intact, no focal deficits Psychiatric: Normal mood and affect  Laboratory Data:   Recent Labs  03/17/13 2200 03/18/13 0505  WBC 11.7* 8.2  HGB 13.6 13.1  HCT 38.1* 38.2*  PLT 142* 142*     Recent Labs  03/17/13 2200 03/18/13 0505  NA 130* 138  K 4.0 3.5  CL 94* 106  GLUCOSE 145* 120*  BUN 40* 28*  CALCIUM 9.3 9.3  CREATININE 3.90* 2.09*     Results for orders placed during the hospital encounter of 03/17/13 (from the past 24 hour(s))  URINALYSIS, ROUTINE W REFLEX MICROSCOPIC     Status: Abnormal   Collection Time    03/17/13 10:00 PM      Result Value Range   Color, Urine YELLOW  YELLOW   APPearance CLEAR  CLEAR   Specific Gravity, Urine 1.011  1.005 - 1.030   pH 5.5  5.0 - 8.0   Glucose, UA NEGATIVE  NEGATIVE mg/dL   Hgb urine dipstick LARGE (*) NEGATIVE   Bilirubin Urine NEGATIVE  NEGATIVE   Ketones, ur NEGATIVE  NEGATIVE mg/dL   Protein, ur NEGATIVE  NEGATIVE mg/dL   Urobilinogen, UA 0.2  0.0 - 1.0 mg/dL   Nitrite NEGATIVE  NEGATIVE   Leukocytes, UA NEGATIVE  NEGATIVE  BASIC METABOLIC PANEL     Status: Abnormal   Collection Time    03/17/13 10:00 PM      Result Value Range   Sodium 130 (*) 135 - 145 mEq/L   Potassium 4.0  3.5 - 5.1 mEq/L   Chloride 94 (*) 96 - 112 mEq/L   CO2 19  19 - 32 mEq/L   Glucose, Bld 145 (*) 70 - 99 mg/dL   BUN 40 (*) 6 - 23 mg/dL   Creatinine, Ser 0.98 (*) 0.50 - 1.35 mg/dL   Calcium 9.3  8.4 - 11.9 mg/dL   GFR calc non Af Amer 13 (*) >90 mL/min   GFR calc Af Amer 16 (*) >90 mL/min  CBC     Status: Abnormal   Collection Time    03/17/13 10:00 PM      Result Value Range   WBC 11.7 (*) 4.0 - 10.5 K/uL    RBC 4.20 (*) 4.22 - 5.81 MIL/uL   Hemoglobin 13.6  13.0 - 17.0  g/dL   HCT 46.9 (*) 62.9 - 52.8 %   MCV 90.7  78.0 - 100.0 fL   MCH 32.4  26.0 - 34.0 pg   MCHC 35.7  30.0 - 36.0 g/dL   RDW 41.3  24.4 - 01.0 %   Platelets 142 (*) 150 - 400 K/uL  URINE MICROSCOPIC-ADD ON     Status: None   Collection Time    03/17/13 10:00 PM      Result Value Range   Squamous Epithelial / LPF RARE  RARE   WBC, UA 0-2  <3 WBC/hpf   RBC / HPF TOO NUMEROUS TO COUNT  <3 RBC/hpf   Bacteria, UA RARE  RARE  COMPREHENSIVE METABOLIC PANEL     Status: Abnormal   Collection Time    03/18/13  5:05 AM      Result Value Range   Sodium 138  135 - 145 mEq/L   Potassium 3.5  3.5 - 5.1 mEq/L   Chloride 106  96 - 112 mEq/L   CO2 24  19 - 32 mEq/L   Glucose, Bld 120 (*) 70 - 99 mg/dL   BUN 28 (*) 6 - 23 mg/dL   Creatinine, Ser 2.72 (*) 0.50 - 1.35 mg/dL   Calcium 9.3  8.4 - 53.6 mg/dL   Total Protein 6.3  6.0 - 8.3 g/dL   Albumin 3.1 (*) 3.5 - 5.2 g/dL   AST 24  0 - 37 U/L   ALT 16  0 - 53 U/L   Alkaline Phosphatase 45  39 - 117 U/L   Total Bilirubin 0.7  0.3 - 1.2 mg/dL   GFR calc non Af Amer 29 (*) >90 mL/min   GFR calc Af Amer 33 (*) >90 mL/min  CBC WITH DIFFERENTIAL     Status: Abnormal   Collection Time    03/18/13  5:05 AM      Result Value Range   WBC 8.2  4.0 - 10.5 K/uL   RBC 4.19 (*) 4.22 - 5.81 MIL/uL   Hemoglobin 13.1  13.0 - 17.0 g/dL   HCT 64.4 (*) 03.4 - 74.2 %   MCV 91.2  78.0 - 100.0 fL   MCH 31.3  26.0 - 34.0 pg   MCHC 34.3  30.0 - 36.0 g/dL   RDW 59.5  63.8 - 75.6 %   Platelets 142 (*) 150 - 400 K/uL   Neutrophils Relative % 76  43 - 77 %   Neutro Abs 6.3  1.7 - 7.7 K/uL   Lymphocytes Relative 10 (*) 12 - 46 %   Lymphs Abs 0.8  0.7 - 4.0 K/uL   Monocytes Relative 13 (*) 3 - 12 %   Monocytes Absolute 1.1 (*) 0.1 - 1.0 K/uL   Eosinophils Relative 1  0 - 5 %   Eosinophils Absolute 0.1  0.0 - 0.7 K/uL   Basophils Relative 0  0 - 1 %   Basophils Absolute 0.0  0.0 - 0.1 K/uL   No  results found for this or any previous visit (from the past 240 hour(s)).  Renal Function:  Recent Labs  03/17/13 2200 03/18/13 0505  CREATININE 3.90* 2.09*   Estimated Creatinine Clearance: 26.3 ml/min (by C-G formula based on Cr of 2.09).  Radiologic Imaging: US Renal  03/17/2013   *RADIOLOGY REPORT*  Clinical Data: Urinary tension.  RENAL/URINARY TRACT ULTRASOUND COMPLETE  Comparison:  None.  Findings:  Right Kidney:  9.2 cm.  Normal echotexture.  No focal abnormality or hydronephrosis.  Left Kidney:  9.6 cm.  Normal echotexture.  No focal abnormality or hydronephrosis.  Bladder:  Foley catheter in place.  Urinary bladder partially decompressed, grossly unremarkable.  Prostate is enlarged.  IMPRESSION: Enlarged prostate.  No hydronephrosis.   Original Report Authenticated By: Charlett Nose, M.D.   Impression/Recommendation  Urinary retention and acute renal insufficiency--retention is likely due to BPH with BOO.  No hydronephrosis noted on RUS and Cr improved with placement of foley.  Pt can d/c home with foley which should be left in place for approx 7 days. Continue Flomax.  He will need to f/u in our office as an outpt for void trial and cysto. He will possibly need urodynamics in the future as well.     Rectal exam will be performed as outpt with discussion of elevated PSAs and possible need for prostate biopsy.      Silas Flood 03/18/2013, 1:40 PM

## 2013-03-18 NOTE — Care Management Note (Addendum)
    Page 1 of 1   03/19/2013     1:01:04 PM   CARE MANAGEMENT NOTE 03/19/2013  Patient:  Glenn Mendez, Glenn Mendez   Account Number:  [id number]  Date Initiated:  03/18/2013  Documentation initiated by:  Lanier Clam  Subjective/Objective Assessment:   ADMITTED W/DIFFICULTY URINATING.     Action/Plan:   FROM HOME W/SPOUSE.   Anticipated DC Date:  03/21/2013   Anticipated DC Plan:  HOME/SELF CARE      DC Planning Services  CM consult      Choice offered to / List presented to:             Status of service:  Completed, signed off Medicare Important Message given?   (If response is "NO", the following Medicare IM given date fields will be blank) Date Medicare IM given:   Date Additional Medicare IM given:    Discharge Disposition:  HOME/SELF CARE  Per UR Regulation:  Reviewed for med. necessity/level of care/duration of stay  If discussed at Long Length of Stay Meetings, dates discussed:    Comments:  03/19/13 Bishoy Cupp RN,BSN NCM [phone] D/C HOME NO NEEDS OR ORDERS.  03/18/13 Matalynn Graff RN,BSN NCM [phone]

## 2013-03-18 NOTE — ED Notes (Signed)
Called Pt. Wife and spoke with her about the room # Pt. Was going to at [facility] and she gave information about the Pt. Having a drink of burbon each night and wanted the EDP and the Accepting Physician to know about this.

## 2013-03-18 NOTE — Progress Notes (Signed)
[facility] PROGRESS NOTE  Glenn Mendez UJW:[id number] DOB: Jun 01, 1932 DOA: 03/17/2013 PCP: No primary provider on file.  Assessment/Plan: Acute kidney injury secondary to obstructive uropathy Renal function improving after foley placed in. obstructive uropathy secondary to enlarged prostate causing bladder outlet obstruction. US shows enlarged prostate without hydronephrosis. continue foley. Urology consult called. Will follow with recommendations. Continue flomax. Will discontinue cipro as UA negative for infection. continue IV fluids and monitor renal fn in am. Monitor urine output   etoh abuse Per wife he drinks  8oz  bourbon daily  monitor on CIWA  Hematuria  possibly from traumatic craterization. Monitor H&H  Code Status: FULL Family Communication: none at bedside Disposition Plan:homeonce stable   Consultants:  Urology consulted  Procedures:  none  Antibiotics:  IV cipro ( 7/7>7/8)  HPI/Subjective: Patient seen and examined this morning. Admission H&P reviewed  Objective: Filed Vitals:   03/17/13 2359 03/18/13 0122 03/18/13 0600 03/18/13 1100  BP: 134/75 149/70 120/63   Pulse: 76 73 75 73  Temp: 98.2 F (36.8 C) 99.2 F (37.3 C) 99.5 F (37.5 C)   TempSrc: Oral Oral Oral   Resp: 20 20 18    Height:  5\' 6"  (1.676 m)    Weight:  67.1 kg (147 lb 14.9 oz)    SpO2: 98% 98% 94%     Intake/Output Summary (Last 24 hours) at 03/18/13 1200 Last data filed at 03/18/13 0900  Gross per 24 hour  Intake 443.75 ml  Output   2620 ml  Net -2176.25 ml   Filed Weights   03/17/13 2105 03/18/13 0122  Weight: 65.772 kg (145 lb) 67.1 kg (147 lb 14.9 oz)    Exam:   General: elderly able in NAD  HEENT: no pallor, moist oral mucosa  Chest: clear b/l, no added sounds  Cardiovascular: NS1&S2, no murmurs, rubs or gallop  Respiratory: clear b/l, no added sounds  Abdomen: soft, NT, ND, BS+, mild suprapubic distention, foley in place draining bloody  urine  Musculoskeletal: warm, no edema   CNS: AAOX3  Data Reviewed: Basic Metabolic Panel:  Recent Labs Lab 03/17/13 2200 03/18/13 0505  NA 130* 138  K 4.0 3.5  CL 94* 106  CO2 19 24  GLUCOSE 145* 120*  BUN 40* 28*  CREATININE 3.90* 2.09*  CALCIUM 9.3 9.3   Liver Function Tests:  Recent Labs Lab 03/18/13 0505  AST 24  ALT 16  ALKPHOS 45  BILITOT 0.7  PROT 6.3  ALBUMIN 3.1*   No results found for this basename: LIPASE, AMYLASE,  in the last 168 hours No results found for this basename: AMMONIA,  in the last 168 hours CBC:  Recent Labs Lab 03/17/13 2200 03/18/13 0505  WBC 11.7* 8.2  NEUTROABS  --  6.3  HGB 13.6 13.1  HCT 38.1* 38.2*  MCV 90.7 91.2  PLT 142* 142*   Cardiac Enzymes: No results found for this basename: CKTOTAL, CKMB, CKMBINDEX, TROPONINI,  in the last 168 hours BNP (last 3 results) No results found for this basename: PROBNP,  in the last 8760 hours CBG: No results found for this basename: GLUCAP,  in the last 168 hours  No results found for this or any previous visit (from the past 240 hour(s)).   Studies: US Renal  03/17/2013   *RADIOLOGY REPORT*  Clinical Data: Urinary tension.  RENAL/URINARY TRACT ULTRASOUND COMPLETE  Comparison:  None.  Findings:  Right Kidney:  9.2 cm.  Normal echotexture.  No focal abnormality or hydronephrosis.  Left Kidney:  9.6 cm.  Normal echotexture.  No focal abnormality or hydronephrosis.  Bladder:  Foley catheter in place.  Urinary bladder partially decompressed, grossly unremarkable.  Prostate is enlarged.  IMPRESSION: Enlarged prostate.  No hydronephrosis.   Original Report Authenticated By: Charlett Nose, M.D.    Scheduled Meds: . ciprofloxacin  400 mg Intravenous Q24H  . folic acid  1 mg Oral Daily  . LORazepam  0-4 mg Oral Q6H   Followed by  . [START ON 03/20/2013] LORazepam  0-4 mg Oral Q12H  . multivitamin with minerals  1 tablet Oral Daily  . sodium chloride  3 mL Intravenous Q12H  . thiamine  100 mg  Oral Daily   Or  . thiamine  100 mg Intravenous Daily   Continuous Infusions: . sodium chloride 75 mL/hr at 03/18/13 0417     Time spent: 20 minutes    Krystall Kruckenberg  [facility] Pager [phone]. If 7PM-7AM, please contact night-coverage at [email or web address], password [facility] 03/18/2013, 12:00 PM  LOS: 1 day

## 2013-03-18 NOTE — H&P (Signed)
[facility] History and Physical  Glenn Mendez OZH:[id number] DOB: 1932/01/15 DOA: 03/17/2013  Referring physician: ER physician at [facility]. PCP: No primary provider on file. Dr. Holley Bouche. [facility].  Chief Complaint: Difficulty urinating.  HPI: Glenn Mendez is a 79 y.o. male with no significant past history has been experiencing difficulty urinating over the last 2 days. Patient's primary care physician had placed patient on Flomax and Cipro yesterday. Despite taking which patient still had difficulty urinating and had suprapubic fullness and discomfort. Denies any dysuria fever chills. Patient has been having urinary dribbling. In the ER patient was placed on Foley catheter and had good urinary drainage. On-call urologist Dr. Margarita Grizzle was consulted by the ER physician at this time Dr. Margarita Grizzle as recommended nothing additional to the Foley catheter, as per the ER physician. Renal sonogram does not show any hydronephrosis. Patient's creatinine is around 3 which is increased as patient does not recall having any renal failure previously. At this time patient has been admitted for further observation.  Review of Systems: As presented in the history of presenting illness, rest negative.  History reviewed. No pertinent past medical history. Past Surgical History  Procedure Laterality Date  . Tonsillectomy    . Hernia repair     Social History:  reports that he has never smoked. He does not have any smokeless tobacco history on file. He reports that he drinks about 19.2 ounces of alcohol per week. He reports that he does not use illicit drugs. Home. where does patient live-- Can do ADLs. Can patient participate in ADLs?  No Known Allergies  Family History  Problem Relation Age of Onset  . Stroke Father       Prior to Admission medications   Medication Sig Start Date End Date Taking? Authorizing Provider  aspirin 81 MG tablet Take 81 mg by mouth  daily.   Yes Historical Provider, MD  ciprofloxacin (CIPRO) 500 MG tablet Take 500 mg by mouth 2 (two) times daily. 10 day course   Yes Historical Provider, MD  folic acid (FOLVITE) 1 MG tablet Take 1 mg by mouth daily.   Yes Historical Provider, MD  tamsulosin (FLOMAX) 0.4 MG CAPS Take 0.4 mg by mouth daily.   Yes Historical Provider, MD   Physical Exam: Filed Vitals:   03/17/13 2105 03/17/13 2359 03/18/13 0122  BP: 143/83 134/75 149/70  Pulse: 70 76 73  Temp: 98.8 F (37.1 C) 98.2 F (36.8 C) 99.2 F (37.3 C)  TempSrc: Oral Oral Oral  Resp: 20 20 20   Height:   5\' 6"  (1.676 m)  Weight: 65.772 kg (145 lb)  67.1 kg (147 lb 14.9 oz)  SpO2: 98% 98% 98%     General:  Well-developed and nourished.  Eyes: Anicteric no pallor.  ENT: No discharge from ears eyes nose normal.  Neck: No mass felt.  Cardiovascular: S1-S2 heard.  Respiratory: No rhonchi or crepitations.  Abdomen: Soft nontender bowel sounds present.  Skin: No rash.  Musculoskeletal: No edema.  Psychiatric: Appears normal.  Neurologic: Alert awake oriented to time place and person. Moves all extremities.  Labs on Admission:  Basic Metabolic Panel:  Recent Labs Lab 03/17/13 2200  NA 130*  K 4.0  CL 94*  CO2 19  GLUCOSE 145*  BUN 40*  CREATININE 3.90*  CALCIUM 9.3   Liver Function Tests: No results found for this basename: AST, ALT, ALKPHOS, BILITOT, PROT, ALBUMIN,  in the last 168 hours No results found for this basename:  LIPASE, AMYLASE,  in the last 168 hours No results found for this basename: AMMONIA,  in the last 168 hours CBC:  Recent Labs Lab 03/17/13 2200  WBC 11.7*  HGB 13.6  HCT 38.1*  MCV 90.7  PLT 142*   Cardiac Enzymes: No results found for this basename: CKTOTAL, CKMB, CKMBINDEX, TROPONINI,  in the last 168 hours  BNP (last 3 results) No results found for this basename: PROBNP,  in the last 8760 hours CBG: No results found for this basename: GLUCAP,  in the last 168  hours  Radiological Exams on Admission: US Renal  03/17/2013   *RADIOLOGY REPORT*  Clinical Data: Urinary tension.  RENAL/URINARY TRACT ULTRASOUND COMPLETE  Comparison:  None.  Findings:  Right Kidney:  9.2 cm.  Normal echotexture.  No focal abnormality or hydronephrosis.  Left Kidney:  9.6 cm.  Normal echotexture.  No focal abnormality or hydronephrosis.  Bladder:  Foley catheter in place.  Urinary bladder partially decompressed, grossly unremarkable.  Prostate is enlarged.  IMPRESSION: Enlarged prostate.  No hydronephrosis.   Original Report Authenticated By: Charlett Nose, M.D.     Assessment/Plan Principal Problem:   Acute renal failure Active Problems:   Urinary obstruction   1. Acute renal failure secondary to obstructive uropathy - continue Foley catheter drainage and gentle hydration with close followup of intake output and metabolic panel. Consult urologist in a.m. for further recommendations. We'll continue Cipro for now. 2. Mild thrombocytopenia probably secondary to alcoholism - follow CBC. 3. History of alcoholism - patient has been placed on alcohol protocol.    Code Status: Full code.  Family Communication: None.  Disposition Plan: Admit to inpatient.    Starleen Trussell N. [facility] Pager [phone].  If 7PM-7AM, please contact night-coverage [email or web address] Password [facility] 03/18/2013, 3:55 AM

## 2013-03-19 LAB — URINE CULTURE

## 2013-03-19 LAB — BASIC METABOLIC PANEL
BUN: 19 mg/dL (ref 6–23)
CO2: 25 mEq/L (ref 19–32)
Calcium: 8.5 mg/dL (ref 8.4–10.5)
Creatinine, Ser: 1.01 mg/dL (ref 0.50–1.35)

## 2013-03-19 MED ORDER — POTASSIUM CHLORIDE CRYS ER 20 MEQ PO TBCR
40.0000 meq | EXTENDED_RELEASE_TABLET | Freq: Once | ORAL | Status: AC
  Administered 2013-03-19: 40 meq via ORAL
  Filled 2013-03-19: qty 2

## 2013-03-19 NOTE — Progress Notes (Signed)
Patient ID: Glenn Mendez, male   DOB: 1932-05-11, 79 y.o.   MRN: [id number]   Subjective: Patient reports no new complaints. He continues to do well clinically.  Objective: Vital signs in last 24 hours: Temp:  [97.8 F (36.6 C)-98.8 F (37.1 C)] 98.2 F (36.8 C) (07/09 0543) Pulse Rate:  [62-75] 62 (07/09 0543) Resp:  [18] 18 (07/09 0543) BP: (137-151)/(71-74) 150/71 mmHg (07/09 0543) SpO2:  [96 %-98 %] 96 % (07/09 0543) Weight:  [64.955 kg (143 lb 3.2 oz)] 64.955 kg (143 lb 3.2 oz) (07/09 0500)  Intake/Output from previous day: 07/08 0701 - 07/09 0700 In: 1445 [P.O.:320; I.V.:1125] Out: 1821 [Urine:1820; Stool:1] Intake/Output this shift:    Physical Exam:  Constitutional: Vital signs reviewed. WD WN in NAD   Eyes: PERRL, No scleral icterus.   Cardiovascular: RRR Pulmonary/Chest: Normal effort Abdominal: Soft. Non-tender.  Genitourinary: Indwelling Foley catheter draining clear urine Extremities: No cyanosis or edema   Lab Results:  Recent Labs  03/17/13 2200 03/18/13 0505  HGB 13.6 13.1  HCT 38.1* 38.2*   BMET  Recent Labs  03/18/13 0505 03/19/13 0459  NA 138 138  K 3.5 3.4*  CL 106 106  CO2 24 25  GLUCOSE 120* 106*  BUN 28* 19  CREATININE 2.09* 1.01  CALCIUM 9.3 8.5   No results found for this basename: LABPT, INR,  in the last 72 hours No results found for this basename: LABURIN,  in the last 72 hours Results for orders placed during the hospital encounter of 03/17/13  URINE CULTURE     Status: None   Collection Time    03/17/13 10:00 PM      Result Value Range Status   Specimen Description URINE, CATHETERIZED   Final   Special Requests NONE   Final   Culture  Setup Time 03/18/2013 08:06   Final   Colony Count NO GROWTH   Final   Culture NO GROWTH   Final   Report Status 03/19/2013 FINAL   Final  URINE CULTURE     Status: None   Collection Time    03/18/13  4:32 AM      Result Value Range Status   Specimen Description URINE, CATHETERIZED    Final   Special Requests NONE   Final   Culture  Setup Time 03/18/2013 09:57   Final   Colony Count NO GROWTH   Final   Culture NO GROWTH   Final   Report Status 03/19/2013 FINAL   Final    Studies/Results: US Renal  03/17/2013   *RADIOLOGY REPORT*  Clinical Data: Urinary tension.  RENAL/URINARY TRACT ULTRASOUND COMPLETE  Comparison:  None.  Findings:  Right Kidney:  9.2 cm.  Normal echotexture.  No focal abnormality or hydronephrosis.  Left Kidney:  9.6 cm.  Normal echotexture.  No focal abnormality or hydronephrosis.  Bladder:  Foley catheter in place.  Urinary bladder partially decompressed, grossly unremarkable.  Prostate is enlarged.  IMPRESSION: Enlarged prostate.  No hydronephrosis.   Original Report Authenticated By: Charlett Nose, M.D.    Assessment/Plan:   Obstructive uropathy. He remains unclear if this is just secondary to BPH with chronic outlet obstruction or whether the patient truly had a concurrent prostatitis. We'll need to assess urinalysis and culture results from his primary care physician. Objectively there was not definitive evidence of prostatitis during this hospitalization. The patient will finish a course of antibiotic therapy and remain on alpha-blocker. He'll be set up next week to come in for a  Foley catheter removal, flexible cystoscopy and voiding trial. If he void successfully we will follow up with him to assure good bladder emptying and clear urinalysis status post his catheterization. If he is unable to void we'll need to proceed with the ear urodynamics.   LOS: 2 days   Jordyn Doane S 03/19/2013, 10:27 AM

## 2013-03-19 NOTE — Discharge Summary (Signed)
Physician Discharge Summary  Glenn Mendez FAO:[id number] DOB: 1931/11/16 DOA: 03/17/2013  PCP: No primary provider on file.  Admit date: 03/17/2013 Discharge date: 03/19/2013  Time spent: 35 minutes  Recommendations for Outpatient Follow-up:  1. Follow up with PCP for repeat B-met and follow renal function. 2. Need to follow up with Dr Isabel Caprice for further evaluation of Obstructive uropathy.   Discharge Diagnoses:    Acute renal failure   Urinary obstruction   Discharge Condition: Improved.   Diet recommendation: Heart Healthy  Filed Weights   03/17/13 2105 03/18/13 0122 03/19/13 0500  Weight: 65.772 kg (145 lb) 67.1 kg (147 lb 14.9 oz) 64.955 kg (143 lb 3.2 oz)    History of present illness:  Glenn Mendez is a 79 y.o. male with no significant past history has been experiencing difficulty urinating over the last 2 days. Patient's primary care physician had placed patient on Flomax and Cipro yesterday. Despite taking which patient still had difficulty urinating and had suprapubic fullness and discomfort. Denies any dysuria fever chills. Patient has been having urinary dribbling. In the ER patient was placed on Foley catheter and had good urinary drainage. On-call urologist Dr. Margarita Grizzle was consulted by the ER physician at this time Dr. Margarita Grizzle as recommended nothing additional to the Foley catheter, as per the ER physician. Renal sonogram does not show any hydronephrosis. Patient's creatinine is around 3 which is increased as patient does not recall having any renal failure previously. At this time patient has been admitted for further observation.   Hospital Course:   Acute kidney injury secondary to obstructive uropathy  Renal function improving after foley placed in. obstructive uropathy secondary to enlarged prostate causing bladder outlet obstruction. US shows enlarged prostate without hydronephrosis.  continue foley. Urology consult called. Dr Isabel Caprice recommend to  Continue with  Flomax, discharge with foley catheter. Voiding trial in the office in 1 week. Will continue with Ciprofloxacin.  Renal function improved, Cr peak to 3.9, cr has decrease to 1.0  etoh use:  Per wife he drinks 8oz bourbon daily  monitor on CIWA. No evidence of DT. Has not required ativan.   Hematuria  possibly from traumatic craterization. Monitor H&H. Need cystoscopy outpatient.    Procedures: Renal US: Enlarged prostate. No hydronephrosis.    Consultations:  Dr Isabel Caprice  Discharge Exam: Filed Vitals:   03/18/13 1300 03/18/13 2130 03/19/13 0500 03/19/13 0543  BP: 137/74 151/72  150/71  Pulse: 75 65  62  Temp: 97.8 F (36.6 C) 98.8 F (37.1 C)  98.2 F (36.8 C)  TempSrc: Oral Oral  Oral  Resp: 18 18  18   Height:      Weight:   64.955 kg (143 lb 3.2 oz)   SpO2: 98% 97%  96%    General: No distress Cardiovascular: S 1, S 2 RRR Respiratory: CTA  Discharge Instructions  Discharge Orders   Future Orders Complete By Expires     Diet - low sodium heart healthy  As directed     Increase activity slowly  As directed         Medication List         aspirin 81 MG tablet  Take 81 mg by mouth daily.     ciprofloxacin 500 MG tablet  Commonly known as:  CIPRO  Take 500 mg by mouth 2 (two) times daily. 10 day course     folic acid 1 MG tablet  Commonly known as:  FOLVITE  Take 1 mg by mouth daily.  tamsulosin 0.4 MG Caps  Commonly known as:  FLOMAX  Take 0.4 mg by mouth daily.       No Known Allergies     Follow-up Information   Follow up with [facility] S, MD. (call office for appt 1 week after discharge from hospital )    Contact information:   [address] [place]                         [place] [address] [phone]        The results of significant diagnostics from this hospitalization (including imaging, microbiology, ancillary and laboratory) are listed below for reference.    Significant Diagnostic Studies: US  Renal  03/17/2013   *RADIOLOGY REPORT*  Clinical Data: Urinary tension.  RENAL/URINARY TRACT ULTRASOUND COMPLETE  Comparison:  None.  Findings:  Right Kidney:  9.2 cm.  Normal echotexture.  No focal abnormality or hydronephrosis.  Left Kidney:  9.6 cm.  Normal echotexture.  No focal abnormality or hydronephrosis.  Bladder:  Foley catheter in place.  Urinary bladder partially decompressed, grossly unremarkable.  Prostate is enlarged.  IMPRESSION: Enlarged prostate.  No hydronephrosis.   Original Report Authenticated By: Charlett Nose, M.D.    Microbiology: Recent Results (from the past 240 hour(s))  URINE CULTURE     Status: None   Collection Time    03/17/13 10:00 PM      Result Value Range Status   Specimen Description URINE, CATHETERIZED   Final   Special Requests NONE   Final   Culture  Setup Time 03/18/2013 08:06   Final   Colony Count NO GROWTH   Final   Culture NO GROWTH   Final   Report Status 03/19/2013 FINAL   Final  URINE CULTURE     Status: None   Collection Time    03/18/13  4:32 AM      Result Value Range Status   Specimen Description URINE, CATHETERIZED   Final   Special Requests NONE   Final   Culture  Setup Time 03/18/2013 09:57   Final   Colony Count NO GROWTH   Final   Culture NO GROWTH   Final   Report Status 03/19/2013 FINAL   Final     Labs: Basic Metabolic Panel:  Recent Labs Lab 03/17/13 2200 03/18/13 0505 03/19/13 0459  NA 130* 138 138  K 4.0 3.5 3.4*  CL 94* 106 106  CO2 19 24 25   GLUCOSE 145* 120* 106*  BUN 40* 28* 19  CREATININE 3.90* 2.09* 1.01  CALCIUM 9.3 9.3 8.5   Liver Function Tests:  Recent Labs Lab 03/18/13 0505  AST 24  ALT 16  ALKPHOS 45  BILITOT 0.7  PROT 6.3  ALBUMIN 3.1*   No results found for this basename: LIPASE, AMYLASE,  in the last 168 hours No results found for this basename: AMMONIA,  in the last 168 hours CBC:  Recent Labs Lab 03/17/13 2200 03/18/13 0505  WBC 11.7* 8.2  NEUTROABS  --  6.3  HGB 13.6 13.1   HCT 38.1* 38.2*  MCV 90.7 91.2  PLT 142* 142*   Cardiac Enzymes: No results found for this basename: CKTOTAL, CKMB, CKMBINDEX, TROPONINI,  in the last 168 hours BNP: BNP (last 3 results) No results found for this basename: PROBNP,  in the last 8760 hours CBG: No results found for this basename: GLUCAP,  in the last 168 hours     Signed:  Colbert Curenton  [facility] 03/19/2013, 9:17 AM

## 2013-04-12 ENCOUNTER — Emergency Department (HOSPITAL_BASED_OUTPATIENT_CLINIC_OR_DEPARTMENT_OTHER)
Admission: EM | Admit: 2013-04-12 | Discharge: 2013-04-12 | Disposition: A | Discharge status: Home / Self Care | Payer: Medicare Other | Attending: Emergency Medicine | Admitting: Emergency Medicine

## 2013-04-12 ENCOUNTER — Encounter (HOSPITAL_BASED_OUTPATIENT_CLINIC_OR_DEPARTMENT_OTHER): Payer: Self-pay | Admitting: *Deleted

## 2013-04-12 DIAGNOSIS — T839XXA Unspecified complication of genitourinary prosthetic device, implant and graft, initial encounter: Secondary | ICD-10-CM

## 2013-04-12 DIAGNOSIS — T83091A Other mechanical complication of indwelling urethral catheter, initial encounter: Secondary | ICD-10-CM | POA: Insufficient documentation

## 2013-04-12 DIAGNOSIS — Y846 Urinary catheterization as the cause of abnormal reaction of the patient, or of later complication, without mention of misadventure at the time of the procedure: Secondary | ICD-10-CM | POA: Insufficient documentation

## 2013-04-12 DIAGNOSIS — Z7982 Long term (current) use of aspirin: Secondary | ICD-10-CM | POA: Insufficient documentation

## 2013-04-12 DIAGNOSIS — Z87448 Personal history of other diseases of urinary system: Secondary | ICD-10-CM | POA: Insufficient documentation

## 2013-04-12 NOTE — ED Provider Notes (Signed)
  CSN: [id number]     Arrival date & time 04/12/13  1333 History     First MD Initiated Contact with Patient 04/12/13 1339     Chief Complaint  Patient presents with  . Urinary Retention    The history is provided by the patient and a significant other.   Patient presents for foley catheter malfunction He reports he was seen by urology two days ago and had urodynamic studies performed and had foley catheter placed He reports he had no issues until this morning when it stopped draining urine and "seems to be out" He reports some abdominal discomfort No fever/vomiting.  No back pain His course is worsening Nothing improves his symptoms  PMH - urinary obstruction  Past Surgical History  Procedure Laterality Date  . Tonsillectomy    . Hernia repair     Family History  Problem Relation Age of Onset  . Stroke Father    History  Substance Use Topics  . Smoking status: Never Smoker   . Smokeless tobacco: Not on file  . Alcohol Use: 19.2 oz/week    32 Shots of liquor per week     Comment: 8oz of burbon each night and has done this for years.    Review of Systems  Constitutional: Negative for fever.  Gastrointestinal: Negative for vomiting.  Genitourinary: Positive for difficulty urinating. Negative for flank pain.  Musculoskeletal: Negative for back pain.  Neurological: Negative for weakness.  All other systems reviewed and are negative.    Allergies  Review of patient's allergies indicates no known allergies.  Home Medications   Current Outpatient Rx  Name  Route  Sig  Dispense  Refill  . aspirin 81 MG tablet   Oral   Take 81 mg by mouth daily.         . ciprofloxacin (CIPRO) 500 MG tablet   Oral   Take 500 mg by mouth 2 (two) times daily. 10 day course         . folic acid (FOLVITE) 1 MG tablet   Oral   Take 1 mg by mouth daily.         . tamsulosin (FLOMAX) 0.4 MG CAPS   Oral   Take 0.4 mg by mouth daily.          BP 184/80  Pulse 70   Temp(Src) 98 F (36.7 C) (Oral)  Resp 20  SpO2 100% Physical Exam CONSTITUTIONAL: Well developed/well nourished HEAD: Normocephalic/atraumatic EYES: EOMI/PERRL ENMT: Mucous membranes moist NECK: supple no meningeal signs SPINE:entire spine nontender CV: S1/S2 noted, no murmurs/rubs/gallops noted LUNGS: Lungs are clear to auscultation bilaterally, no apparent distress ABDOMEN: soft, nontender, no rebound or guarding GU:no cva tenderness. Catheter in place.  No signs of trauma.  Minimal yellow urin is in bag.   NEURO: Pt is awake/alert, moves all extremitiesx4 EXTREMITIES: pulses normal, full ROM SKIN: warm, color normal PSYCH: no abnormalities of mood noted  ED Course   Procedures  Catheter was flushed per nursing and he had significant urine output He feels improved.  The catheter has been confirmed in correct position and he is feeling well Stable for d/c home MDM  Nursing notes including past medical history and social history reviewed and considered in documentation Previous records reviewed and considered - recent inpatient records reviewed   Joya Gaskins, MD 04/12/13 1443

## 2013-04-12 NOTE — ED Notes (Addendum)
Urinary cather was placed Thursday and patient states that it keep "coming out". However, the catheter is not any farther out than normal. But patient states that it is not working properly.

## 2013-04-14 ENCOUNTER — Other Ambulatory Visit: Payer: Self-pay | Admitting: Urology

## 2013-04-16 ENCOUNTER — Other Ambulatory Visit (HOSPITAL_COMMUNITY): Payer: Self-pay | Admitting: Urology

## 2013-04-16 ENCOUNTER — Encounter (HOSPITAL_COMMUNITY): Payer: Self-pay | Admitting: Pharmacy Technician

## 2013-04-16 NOTE — Patient Instructions (Addendum)
20 Johari Pinney  04/16/2013   Your procedure is scheduled on:  04/23/13  [facility]  Report to [facility] at  0815     AM.  Call this number if you have problems the morning of surgery: [phone]       Remember:   Do not eat food  Or drink :After Midnight.TUESDAY NIGHT   Take these medicines the morning of surgery with A SIP OF WATER: NONE   .  Contacts, dentures or partial plates can not be worn to surgery  Leave suitcase in the car. After surgery it may be brought to your room.  For patients admitted to the hospital, checkout time is 11:00 AM day of  discharge.             SPECIAL INSTRUCTIONS- SEE [facility] PREPARING FOR SURGERY INSTRUCTION SHEET-     DO NOT WEAR JEWELRY, LOTIONS, POWDERS, OR PERFUMES.  WOMEN-- DO NOT SHAVE LEGS OR UNDERARMS FOR 12 HOURS BEFORE SHOWERS. MEN MAY SHAVE FACE.  Patients discharged the day of surgery will not be allowed to drive home. IF going home the day of surgery, you must have a driver and someone to stay with you for the first 24 hours  Name and phone number of your driver:  Overnight stay        BETTY  Wife                                                                                                                                                 Clydia Nieves  PST [phone]  [phone]                 FAILURE TO FOLLOW THESE INSTRUCTIONS MAY RESULT IN  CANCELLATION   OF YOUR SURGERY                                                  Patient Signature _____________________________

## 2013-04-16 NOTE — Progress Notes (Signed)
Dr Isabel Caprice-  Need Pre Op Orders Please-  Has PST appt 04/17/13  Thanks

## 2013-04-17 ENCOUNTER — Encounter (HOSPITAL_COMMUNITY)
Admission: RE | Admit: 2013-04-17 | Discharge: 2013-04-17 | Disposition: A | Discharge status: Home / Self Care | Payer: Medicare Other | Source: Ambulatory Visit | Attending: Urology | Admitting: Urology

## 2013-04-17 ENCOUNTER — Ambulatory Visit (HOSPITAL_COMMUNITY)
Admission: RE | Admit: 2013-04-17 | Discharge: 2013-04-17 | Disposition: A | Discharge status: Home / Self Care | Payer: Medicare Other | Source: Ambulatory Visit | Attending: Urology | Admitting: Urology

## 2013-04-17 ENCOUNTER — Encounter (HOSPITAL_COMMUNITY): Payer: Self-pay

## 2013-04-17 DIAGNOSIS — M4120 Other idiopathic scoliosis, site unspecified: Secondary | ICD-10-CM | POA: Insufficient documentation

## 2013-04-17 DIAGNOSIS — Z01810 Encounter for preprocedural cardiovascular examination: Secondary | ICD-10-CM | POA: Insufficient documentation

## 2013-04-17 DIAGNOSIS — Z01812 Encounter for preprocedural laboratory examination: Secondary | ICD-10-CM | POA: Insufficient documentation

## 2013-04-17 DIAGNOSIS — Z978 Presence of other specified devices: Secondary | ICD-10-CM

## 2013-04-17 DIAGNOSIS — Z01818 Encounter for other preprocedural examination: Secondary | ICD-10-CM | POA: Insufficient documentation

## 2013-04-17 HISTORY — DX: Benign prostatic hyperplasia without lower urinary tract symptoms: N40.0

## 2013-04-17 HISTORY — DX: Presence of other specified devices: Z97.8

## 2013-04-17 HISTORY — DX: Chronic or unspecified duodenal ulcer with hemorrhage: K26.4

## 2013-04-17 HISTORY — DX: Presence of urogenital implants: Z96.0

## 2013-04-17 HISTORY — DX: Personal history of other medical treatment: Z92.89

## 2013-04-17 LAB — BASIC METABOLIC PANEL
Calcium: 9.7 mg/dL (ref 8.4–10.5)
GFR calc non Af Amer: 77 mL/min — ABNORMAL LOW (ref >90)
Glucose, Bld: 99 mg/dL (ref 70–99)
Sodium: 140 mEq/L (ref 135–145)

## 2013-04-17 LAB — CBC
MCH: 30.9 pg (ref 26.0–34.0)
MCHC: 33.3 g/dL (ref 30.0–36.0)
Platelets: 192 10*3/uL (ref 150–400)
RBC: 4.46 MIL/uL (ref 4.22–5.81)

## 2013-04-17 IMAGING — CR DG CHEST 2V
2 series · 2 of 2 positions shown · non-contrast
Comparison: [DATE]

CLINICAL DATA: Preop for TURP

CHEST - 2 VIEW

[w chest pa]
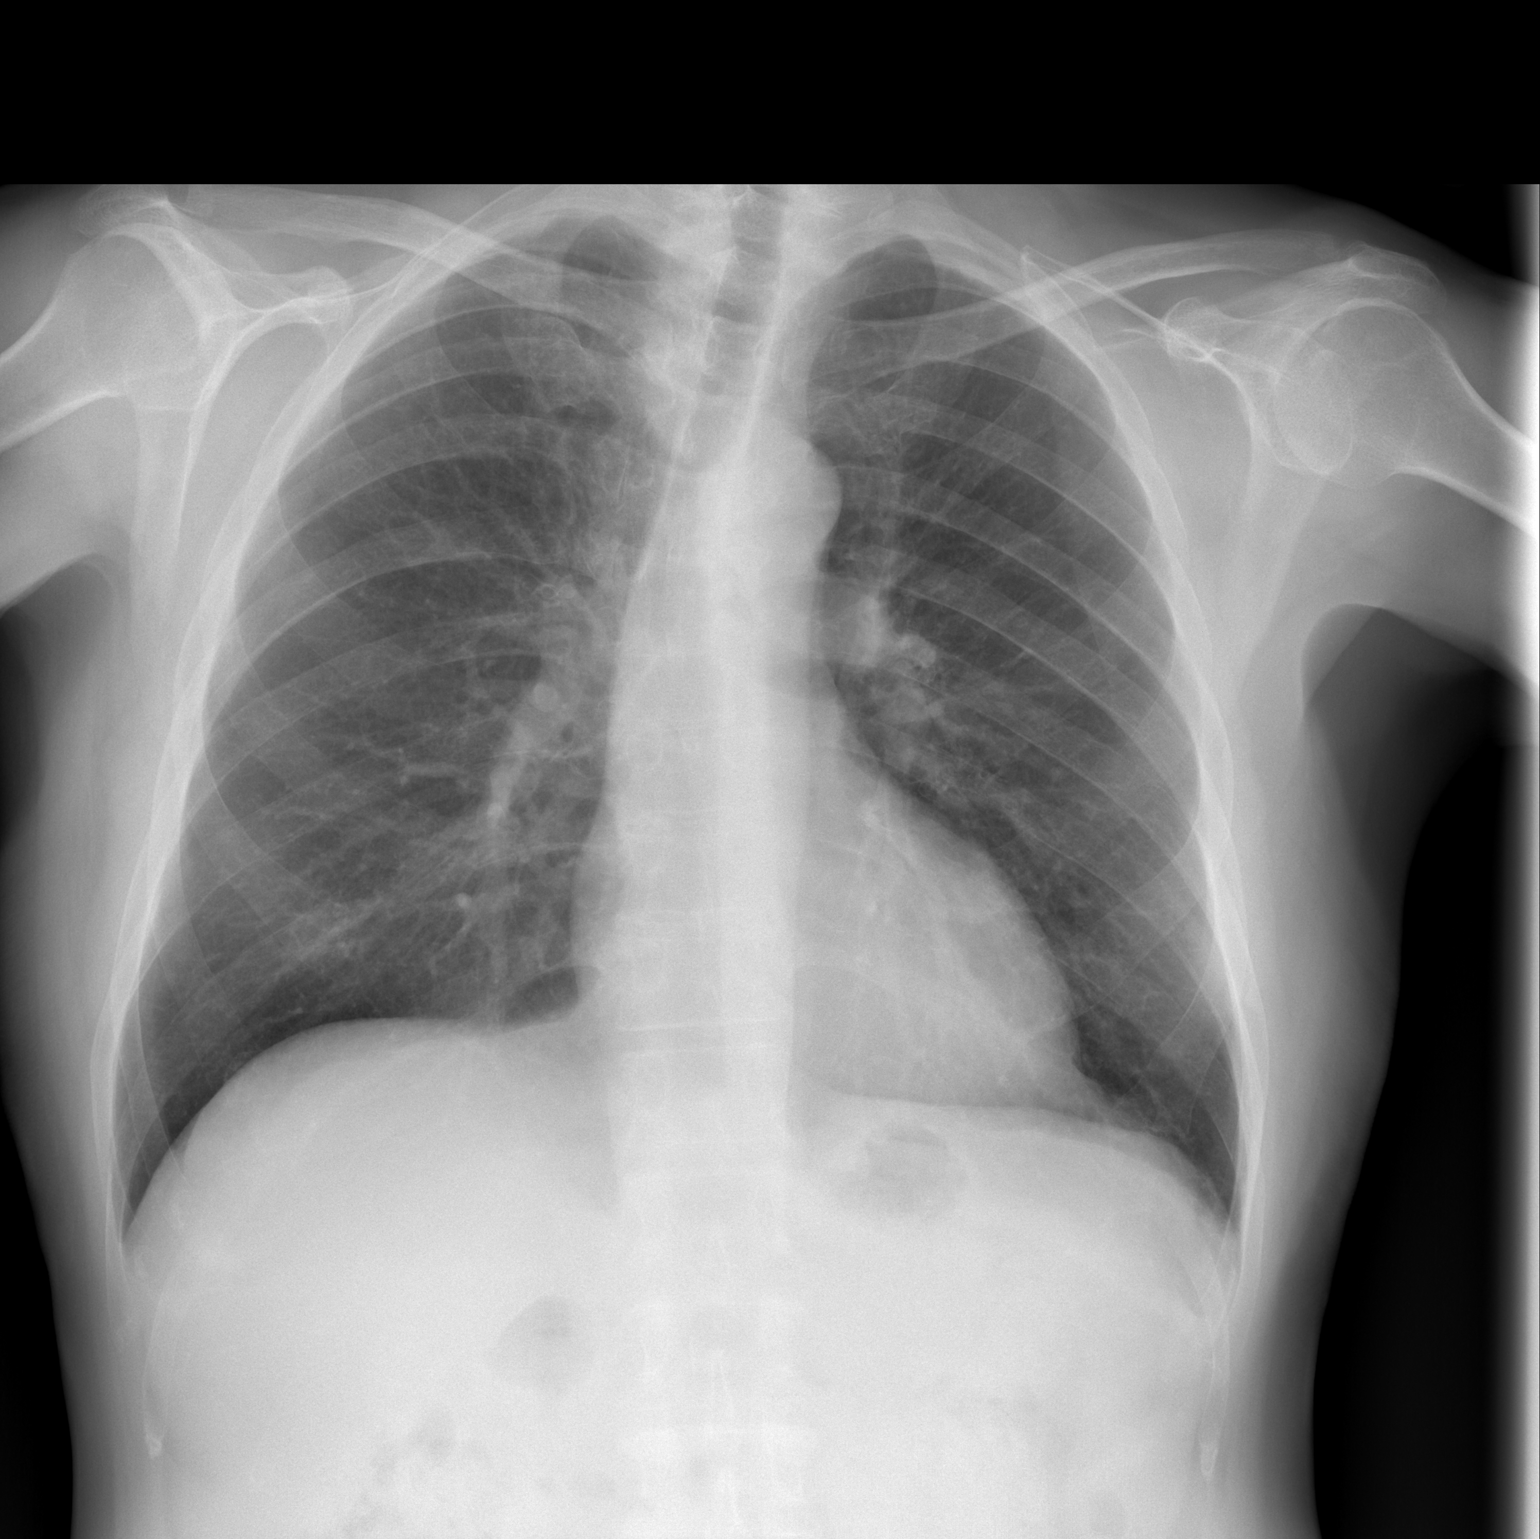

[w chest lat]
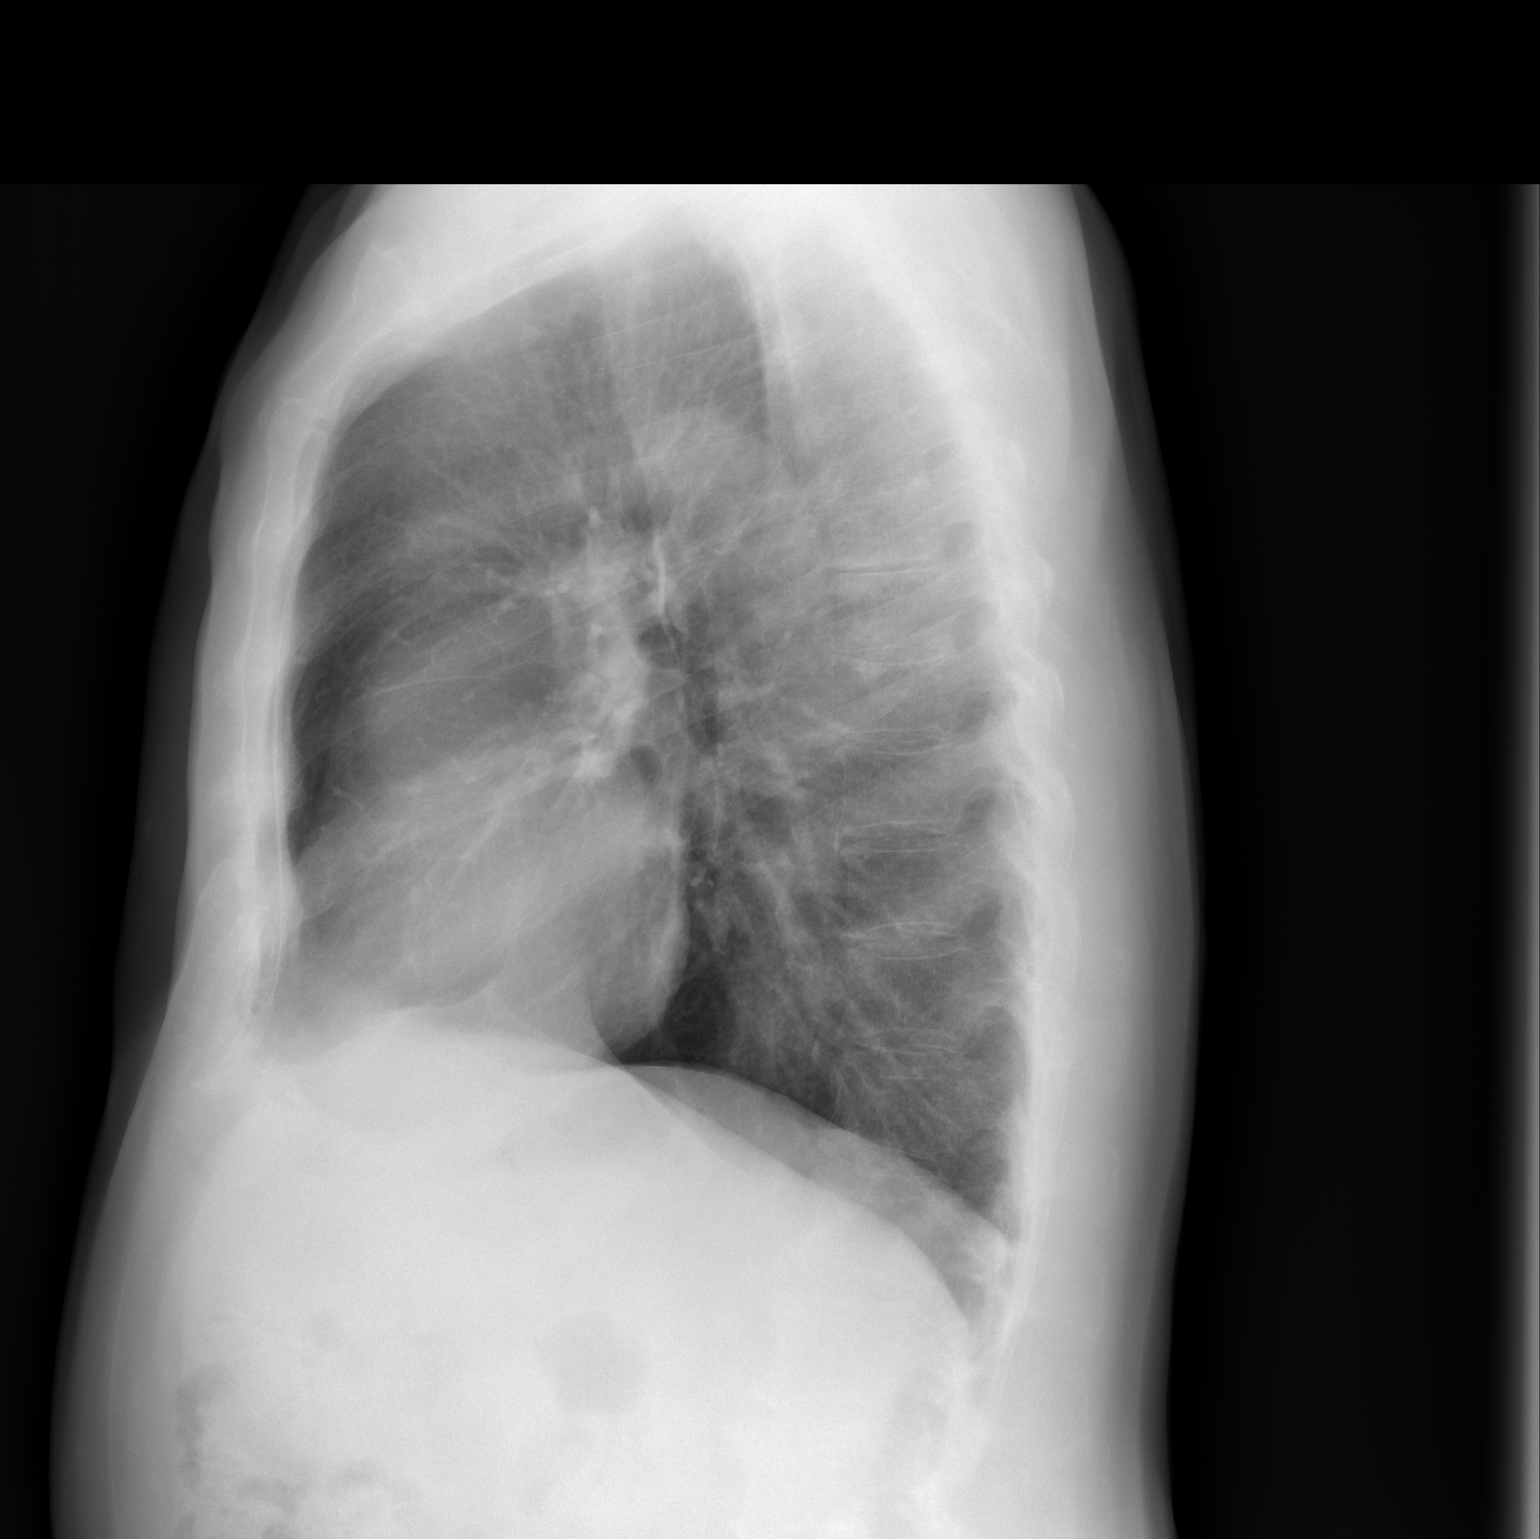

[2 of 2 positions shown; findings below may reference images not displayed]

FINDINGS: Cardiomediastinal silhouette is stable.  No acute
infiltrate or pleural effusion.  No pulmonary edema.  Mild thoracic
dextroscoliosis.
IMPRESSION: No active disease.  Mild thoracic dextroscoliosis.

## 2013-04-17 NOTE — Progress Notes (Signed)
Dr Isabel Caprice-  Need Pre Op Orders Please  Thank you

## 2013-04-22 MED ORDER — GENTAMICIN SULFATE 40 MG/ML IJ SOLN
325.0000 mg | INTRAVENOUS | Status: AC
  Administered 2013-04-23: 325 mg via INTRAVENOUS
  Filled 2013-04-22: qty 8.13

## 2013-04-22 NOTE — Anesthesia Preprocedure Evaluation (Addendum)
Anesthesia Evaluation  Patient identified by MRN, date of birth, ID band Patient awake    Reviewed: Allergy & Precautions, H&P , NPO status , Patient's Chart, lab work & pertinent test results  Airway Mallampati: II TM Distance: >3 FB Neck ROM: Limited    Dental  (+) Dental Advisory Given and Teeth Intact   Pulmonary neg pulmonary ROS,  breath sounds clear to auscultation        Cardiovascular negative cardio ROS  Rhythm:Regular Rate:Normal     Neuro/Psych negative neurological ROS  negative psych ROS   GI/Hepatic Neg liver ROS, PUD,   Endo/Other  negative endocrine ROS  Renal/GU ARFRenal diseasenegative Renal ROS     Musculoskeletal negative musculoskeletal ROS (+)   Abdominal   Peds  Hematology negative hematology ROS (+)   Anesthesia Other Findings   Reproductive/Obstetrics                          Anesthesia Physical Anesthesia Plan  ASA: II  Anesthesia Plan: General   Post-op Pain Management:    Induction: Intravenous  Airway Management Planned: LMA  Additional Equipment:   Intra-op Plan:   Post-operative Plan: Extubation in OR  Informed Consent: I have reviewed the patients History and Physical, chart, labs and discussed the procedure including the risks, benefits and alternatives for the proposed anesthesia with the patient or authorized representative who has indicated his/her understanding and acceptance.   Dental advisory given  Plan Discussed with: CRNA  Anesthesia Plan Comments:        Anesthesia Quick Evaluation

## 2013-04-23 ENCOUNTER — Encounter (HOSPITAL_COMMUNITY): Payer: Self-pay | Admitting: *Deleted

## 2013-04-23 ENCOUNTER — Encounter (HOSPITAL_COMMUNITY)
Admission: RE | Disposition: A | Discharge status: Home / Self Care | Payer: Self-pay | Source: Ambulatory Visit | Attending: Urology

## 2013-04-23 ENCOUNTER — Ambulatory Visit (HOSPITAL_COMMUNITY): Payer: Medicare Other | Admitting: Anesthesiology

## 2013-04-23 ENCOUNTER — Ambulatory Visit (HOSPITAL_COMMUNITY)
Admission: RE | Admit: 2013-04-23 | Discharge: 2013-04-24 | Disposition: A | Discharge status: Home / Self Care | Payer: Medicare Other | Source: Ambulatory Visit | Attending: Urology | Admitting: Urology

## 2013-04-23 ENCOUNTER — Encounter (HOSPITAL_COMMUNITY): Payer: Self-pay | Admitting: Anesthesiology

## 2013-04-23 DIAGNOSIS — N138 Other obstructive and reflux uropathy: Secondary | ICD-10-CM | POA: Insufficient documentation

## 2013-04-23 DIAGNOSIS — R339 Retention of urine, unspecified: Secondary | ICD-10-CM | POA: Insufficient documentation

## 2013-04-23 DIAGNOSIS — N401 Enlarged prostate with lower urinary tract symptoms: Secondary | ICD-10-CM | POA: Insufficient documentation

## 2013-04-23 HISTORY — PX: INSERTION OF SUPRAPUBIC CATHETER: SHX5870

## 2013-04-23 HISTORY — PX: TRANSURETHRAL RESECTION OF PROSTATE: SHX73

## 2013-04-23 SURGERY — TRANSURETHRAL RESECTION OF THE PROSTATE WITH GYRUS INSTRUMENTS
Anesthesia: General | Wound class: Clean Contaminated

## 2013-04-23 MED ORDER — DEXTROSE 5 % IV SOLN
1.0000 g | INTRAVENOUS | Status: DC
  Administered 2013-04-23: 1 g via INTRAVENOUS
  Filled 2013-04-23 (×2): qty 10

## 2013-04-23 MED ORDER — KCL IN DEXTROSE-NACL 20-5-0.45 MEQ/L-%-% IV SOLN
INTRAVENOUS | Status: DC
  Administered 2013-04-23 – 2013-04-24 (×2): via INTRAVENOUS
  Filled 2013-04-23 (×2): qty 1000

## 2013-04-23 MED ORDER — ONDANSETRON HCL 4 MG/2ML IJ SOLN: 4.0000 mg | INTRAMUSCULAR | Status: DC | PRN

## 2013-04-23 MED ORDER — LIDOCAINE HCL 2 % EX GEL
CUTANEOUS | Status: DC | PRN
  Administered 2013-04-23: 1 via URETHRAL

## 2013-04-23 MED ORDER — HYDROCODONE-ACETAMINOPHEN 5-325 MG PO TABS: 1.0000 | ORAL_TABLET | ORAL | Status: DC | PRN

## 2013-04-23 MED ORDER — PROMETHAZINE HCL 25 MG/ML IJ SOLN: 6.2500 mg | INTRAMUSCULAR | Status: DC | PRN

## 2013-04-23 MED ORDER — PHENOL 1.4 % MT LIQD
1.0000 | OROMUCOSAL | Status: DC | PRN
  Administered 2013-04-23: 1 via OROMUCOSAL
  Filled 2013-04-23: qty 177

## 2013-04-23 MED ORDER — MEPERIDINE HCL 50 MG/ML IJ SOLN: 6.2500 mg | INTRAMUSCULAR | Status: DC | PRN

## 2013-04-23 MED ORDER — HYDROMORPHONE HCL PF 1 MG/ML IJ SOLN: 0.2500 mg | INTRAMUSCULAR | Status: DC | PRN

## 2013-04-23 MED ORDER — DOCUSATE SODIUM 100 MG PO CAPS
100.0000 mg | ORAL_CAPSULE | Freq: Two times a day (BID) | ORAL | Status: DC
  Administered 2013-04-23 – 2013-04-24 (×2): 100 mg via ORAL
  Filled 2013-04-23 (×3): qty 1

## 2013-04-23 MED ORDER — TAMSULOSIN HCL 0.4 MG PO CAPS
0.4000 mg | ORAL_CAPSULE | Freq: Every day | ORAL | Status: DC
  Administered 2013-04-23 – 2013-04-24 (×2): 0.4 mg via ORAL
  Filled 2013-04-23 (×2): qty 1

## 2013-04-23 MED ORDER — SODIUM CHLORIDE 0.9 % IR SOLN
Status: DC | PRN
  Administered 2013-04-23: 27000 mL via INTRAVESICAL

## 2013-04-23 MED ORDER — OXYBUTYNIN CHLORIDE 5 MG PO TABS
5.0000 mg | ORAL_TABLET | Freq: Three times a day (TID) | ORAL | Status: DC | PRN
  Administered 2013-04-23: 5 mg via ORAL
  Filled 2013-04-23 (×2): qty 1

## 2013-04-23 MED ORDER — FENTANYL CITRATE 0.05 MG/ML IJ SOLN
INTRAMUSCULAR | Status: DC | PRN
  Administered 2013-04-23 (×2): 25 ug via INTRAVENOUS
  Administered 2013-04-23: 50 ug via INTRAVENOUS
  Administered 2013-04-23 (×2): 25 ug via INTRAVENOUS

## 2013-04-23 MED ORDER — CEFAZOLIN SODIUM-DEXTROSE 2-3 GM-% IV SOLR
2.0000 g | INTRAVENOUS | Status: AC
  Administered 2013-04-23: 2 g via INTRAVENOUS

## 2013-04-23 MED ORDER — ONDANSETRON HCL 4 MG/2ML IJ SOLN
INTRAMUSCULAR | Status: DC | PRN
  Administered 2013-04-23: 4 mg via INTRAVENOUS

## 2013-04-23 MED ORDER — DEXAMETHASONE SODIUM PHOSPHATE 10 MG/ML IJ SOLN
INTRAMUSCULAR | Status: DC | PRN
  Administered 2013-04-23: 10 mg via INTRAVENOUS

## 2013-04-23 MED ORDER — LACTATED RINGERS IV SOLN
INTRAVENOUS | Status: DC | PRN
  Administered 2013-04-23 (×2): via INTRAVENOUS

## 2013-04-23 MED ORDER — MORPHINE SULFATE 2 MG/ML IJ SOLN: 2.0000 mg | INTRAMUSCULAR | Status: DC | PRN

## 2013-04-23 MED ORDER — LIDOCAINE HCL 2 % EX GEL
CUTANEOUS | Status: AC
  Filled 2013-04-23: qty 10

## 2013-04-23 MED ORDER — OXYCODONE HCL 5 MG PO TABS: 5.0000 mg | ORAL_TABLET | Freq: Once | ORAL | Status: DC | PRN

## 2013-04-23 MED ORDER — PROPOFOL 10 MG/ML IV BOLUS
INTRAVENOUS | Status: DC | PRN
  Administered 2013-04-23: 150 mg via INTRAVENOUS

## 2013-04-23 MED ORDER — OXYCODONE HCL 5 MG/5ML PO SOLN
5.0000 mg | Freq: Once | ORAL | Status: DC | PRN
  Filled 2013-04-23: qty 5

## 2013-04-23 MED ORDER — CEFAZOLIN SODIUM-DEXTROSE 2-3 GM-% IV SOLR
INTRAVENOUS | Status: AC
  Filled 2013-04-23: qty 50

## 2013-04-23 MED ORDER — MENTHOL 3 MG MT LOZG
1.0000 | LOZENGE | OROMUCOSAL | Status: DC | PRN
  Filled 2013-04-23: qty 9

## 2013-04-23 MED ORDER — LACTATED RINGERS IV SOLN: INTRAVENOUS | Status: DC

## 2013-04-23 SURGICAL SUPPLY — 36 items
BAG URINE DRAINAGE (UROLOGICAL SUPPLIES) ×2 IMPLANT
BAG URINE LEG 500ML (DRAIN) IMPLANT
BAG URO CATCHER STRL LF (DRAPE) ×2 IMPLANT
BLADE SURG 15 STRL LF DISP TIS (BLADE) ×1 IMPLANT
BLADE SURG 15 STRL SS (BLADE) ×1
CATH FOLEY 2WAY SLVR  5CC 18FR (CATHETERS) ×1
CATH FOLEY 2WAY SLVR 30CC 24FR (CATHETERS) ×2 IMPLANT
CATH FOLEY 2WAY SLVR 5CC 18FR (CATHETERS) ×1 IMPLANT
CATH FOLEY 3WAY 30CC 22FR (CATHETERS) IMPLANT
CLOTH BEACON ORANGE TIMEOUT ST (SAFETY) ×2 IMPLANT
COVER SURGICAL LIGHT HANDLE (MISCELLANEOUS) IMPLANT
DRAPE CAMERA CLOSED 9X96 (DRAPES) ×2 IMPLANT
ELECT LOOP MED HF 24F 12D CBL (CLIP) ×2 IMPLANT
ELECT REM PT RETURN 9FT ADLT (ELECTROSURGICAL)
ELECTRODE REM PT RTRN 9FT ADLT (ELECTROSURGICAL) IMPLANT
EVACUATOR MICROVAS BLADDER (UROLOGICAL SUPPLIES) IMPLANT
GLOVE BIOGEL M STRL SZ7.5 (GLOVE) ×10 IMPLANT
GLOVE SURG SS PI 8.0 STRL IVOR (GLOVE) IMPLANT
GOWN STRL NON-REIN LRG LVL3 (GOWN DISPOSABLE) ×4 IMPLANT
GOWN STRL REIN XL XLG (GOWN DISPOSABLE) IMPLANT
HOLDER FOLEY CATH W/STRAP (MISCELLANEOUS) ×2 IMPLANT
KIT ASPIRATION TUBING (SET/KITS/TRAYS/PACK) ×2 IMPLANT
KIT SUPRAPUBIC CATH (MISCELLANEOUS) IMPLANT
LOOPS RESECTOSCOPE DISP (ELECTROSURGICAL) IMPLANT
MANIFOLD NEPTUNE II (INSTRUMENTS) ×2 IMPLANT
NEEDLE HYPO 22GX1.5 SAFETY (NEEDLE) IMPLANT
NS IRRIG 1000ML POUR BTL (IV SOLUTION) ×2 IMPLANT
PACK CYSTO (CUSTOM PROCEDURE TRAY) ×2 IMPLANT
PENCIL BUTTON HOLSTER BLD 10FT (ELECTRODE) IMPLANT
PLUG CATH AND CAP STER (CATHETERS) IMPLANT
SUT ETHILON 3 0 FSL (SUTURE) IMPLANT
SUT ETHILON 3 0 PS 1 (SUTURE) ×2 IMPLANT
SYR 30ML LL (SYRINGE) ×2 IMPLANT
TOWEL OR 17X26 10 PK STRL BLUE (TOWEL DISPOSABLE) IMPLANT
TUBING CONNECTING 10 (TUBING) ×2 IMPLANT
WATER STERILE IRR 3000ML UROMA (IV SOLUTION) IMPLANT

## 2013-04-23 NOTE — Transfer of Care (Signed)
Immediate Anesthesia Transfer of Care Note  Patient: Glenn Mendez  Procedure(s) Performed: Procedure(s): TRANSURETHRAL RESECTION OF THE PROSTATE WITH GYRUS INSTRUMENTS (N/A) INSERTION OF SUPRAPUBIC CATHETER (N/A)  Patient Location: PACU  Anesthesia Type:General  Level of Consciousness: awake, alert , oriented and patient cooperative  Airway & Oxygen Therapy: Patient Spontanous Breathing and Patient connected to face mask oxygen  Post-op Assessment: Report given to PACU RN and Post -op Vital signs reviewed and stable  Post vital signs: Reviewed and stable  Complications: No apparent anesthesia complications

## 2013-04-23 NOTE — H&P (Signed)
History of Present Illness        Glenn Mendez  is currently 79 years of age and in the past was a longstanding patient of Dr. Darvin Neighbours' for some BPH and an elevated PSA. He was last here about 4 years ago around the time Dr. Earlene Plater left. Glenn Mendez has continued over the years to have an elevated PSA but it does not appear to have changed much over the last 3-4 years. He has generally been satisfied with his voiding, but several days prior to his admission to the hospital, he began experiencing some increased frequency with a weakening of his stream and a dribbling urination. By history, he was in a fairly classic overflow situation. The patient presented to the hospital and was noted to have a 1500 cc residual urine. More importantly, his creatinine was elevated at 3.9. Ultrasound showed an enlarged prostate, but no hydronephrosis. The patient was admitted for observation and had a vigorous diuresis. His creatinine returned to normal and upon discharge was approximately 1.1. The patient had been seen by his primary care physician and thought maybe to have some prostatitis, although I was unable to see any definitive culture data. He was also started on Flomax.   Again, we saw him in the office and once his renal function normalized, suggested that he could be managed as an outpatient. He has remained on tamsulosin.   He did undergo cystoscopy and had evidence of visual obstruction. He failed a voiding trial. He is now status post urodynamics. The patient was noted to have reduced bladder sensation with a first sensation at over 500 mL desire at around 800 mL. There is no evidence of bladder instability. On pressure flow study the patient was able to generate a contraction. He was only able to void a few milliliters. Maximum detrusor pressure was 40 cm of water pressure. In summary he was felt to have reduced sensation with some loss of compliance at maximum capacity. The patient appears to have ongoing urinary  retention the base of the obstruction along with certain evidence of decompensated bladder.   Past Medical History Problems  1. History of  Hypercholesterolemia 272.0  Surgical History Problems  1. History of  Hernia Repair 2. History of  Tonsillectomy  Current Meds 1. Adult Aspirin Low Strength 81 MG Oral Tablet Dispersible; Therapy: (Recorded:16Jul2014) to 2. Cipro 500 MG Oral Tablet; Therapy: (Recorded:16Jul2014) to 3. Folic Acid 400 MCG Oral Tablet; Therapy: (Recorded:20Feb2008) to 4. LORazepam TABS; Therapy: (Recorded:16Jul2014) to 5. Multi-Vitamin TABS; Therapy: (Recorded:16Jul2014) to 6. Proctosol HC 2.5 % Rectal Cream; Therapy: 07Jul2014 to 7. Tamsulosin HCl 0.4 MG Oral Capsule; Therapy: 07Jul2014 to 8. Thiamine HCl 100 MG Oral Tablet; Therapy: (Recorded:16Jul2014) to  Allergies Medication  1. No Known Drug Allergies  Family History Problems  1. Paternal history of  Death In The Family Father 2. Maternal history of  Death In The Family Mother 63yrs 3. Family history of  Family Health Status Number Of Children 1 son 2 daughters 4. Paternal history of  Stroke Syndrome V17.1  Social History Problems  1. Alcohol Use 2. Caffeine Use 3 per day 3. Marital History - Currently Married 4. Occupation: Retired 5. History of  Tobacco Use V15.82 smoked 1/2 per day for 9 yearsquit for 44 years  Review of Systems Genitourinary, constitutional, skin, eye, otolaryngeal, hematologic/lymphatic, cardiovascular, pulmonary, endocrine, musculoskeletal, gastrointestinal, neurological and psychiatric system(s) were reviewed and pertinent findings if present are noted.  Genitourinary: urinary frequency, nocturia, weak urinary stream and  penile pain.    Vitals Vital Signs [Data Includes: Last 1 Day]  01Aug2014 04:23PM  Blood Pressure: 161 / 84 Temperature: 99.1 F Heart Rate: 67  wd wn MALE IN nad Resp: nl effort Card: rrr Abd: soft/ nt/no masses Gu: Indwelling catheter/ 2+  prostate Ext: ok  Assessment Assessed  1. Bladder Neck Obstruction 596.0 2. Acute Urinary Retention 788.20 3. Benign Prostatic Hypertrophy With Urinary Obstruction 600.01  Plan Acute Urinary Retention (788.20)  1. Follow-up Schedule Surgery Office  Follow-up  Done: 01Aug2014 Benign Prostatic Hypertrophy With Urinary Obstruction (600.01)  2. Tamsulosin HCl 0.4 MG Oral Capsule; TAKE 1 CAPSULE Daily; Therapy: 07Jul2014 to  (Evaluate:27Feb2015)  Requested for: 01Aug2014; Last Rx:01Aug2014; Edited  Discussion/Summary   Glenn Mendez has had ongoing urinary retention with obstructive uropathy and history of bilateral hydronephrosis and acute renal failure. Fortunately, his renal function has returned to normal with catheter drainage. Cystoscopically, he has evidence of visual obstruction. Urodynamics did suggest some reduced bladder contractility and reduced sensation. I think Glenn Mendez has probably had some longstanding bladder neck obstruction and has had probably an elevated postvoid residual, which has been slowly worsening. He subsequently developed an overflow situation. At this point I think his best option is to proceed with definitive relief of his outlet obstruction with a Gyrus TURP. I would anticipate overnight stay in the hospital. I think given his reduced bladder contractility, it would make sense to place a suprapubic tube concurrently and allow [place] to use that to assess his postvoid residuals postoperatively. If after his TUR, he is able to void with reasonable emptying of his bladder, the suprapubic tube can be removed. If he continues to have difficulty with ongoing urinary retention and/or inability to empty his bladder completely, the suprapubic tube will be a better option, at least initially. We will attempt to get him on the schedule for as soon as possible. Procedure and recovery were discussed with him and his wife at length today.

## 2013-04-23 NOTE — Anesthesia Postprocedure Evaluation (Signed)
Anesthesia Post Note  Patient: Glenn Mendez  Procedure(s) Performed: Procedure(s) (LRB): TRANSURETHRAL RESECTION OF THE PROSTATE WITH GYRUS INSTRUMENTS (N/A) INSERTION OF SUPRAPUBIC CATHETER (N/A)  Anesthesia type: General  Patient location: PACU  Post pain: Pain level controlled  Post assessment: Post-op Vital signs reviewed  Last Vitals: BP 170/71  Pulse 63  Temp(Src) 37 C (Oral)  Resp 14  SpO2 100%  Post vital signs: Reviewed  Level of consciousness: sedated  Complications: No apparent anesthesia complications

## 2013-04-23 NOTE — Op Note (Signed)
Preoperative diagnosis: BPH with acute urinary retention and probable neurogenic bladder Postoperative diagnosis: Same  Procedure: TURP with placement of suprapubic tube   Surgeon: Valetta Fuller M.D.  Anesthesia: Gen.  Indications: Mr. Glenn Mendez has had known prostatic enlargement. He recently developed acute urinary retention at 1500 cc residual. Creatinine was also elevated at approximately 3.9 and the patient had no evidence of hydronephrosis. He did have obvious obstructive uropathy and with catheter drainage his creatinine improved to 1.1. The patient failed voiding trial on medical therapy. Urodynamics showed reduced bladder sensation. The patient was able to generate a detrusor contraction but it was weak. Visually he had substantial trilobar hyperplasia with visual obstruction. 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 He presents now for attempted relief of obstruction with TURP and placement of a suprapubic tube in case he is still unable to void successfully. Risks and benefits of this procedure were discussed at length with the patient and his wife.     Technique and findings: Patient was brought to the operating room where he had successful induction of general anesthesia. He was placed in lithotomy position prepped draped usual manner. He is indwelling Foley catheter was removed and he was prepped and draped in usual manner. The patient did receive perioperative antibiotics with gentamicin and Ancef. PAS compression boots were placed. Appropriate surgical timeout was performed. A continuous flow resectoscope was utilized with gyrus instrumentation and saline as an irrigant. The bladder showed moderate trabecular change. There was again a 6 significant middle lobe component to his prostate. Patient had considerable trilobar laser with proximally 8200 g prostate and visual obstruction. Resection was  performed initially of the middle lobe. At down to the capsular fibers. I ureteral orifices were identified bilaterally and preserved. We then turned attention to the large left lateral prostatic lobe. This was resected anteriorly to posteriorly from base to the apex of the prostate. A proximally 40-50 g of tissue was resected. The patient's prostate was quite bloody with moderate bleeders throughout the resection. I felt that at completion of this that given the resection of the middle lobe and the level of the prostate that there really was not any significant remaining visual obstruction and ongoing resection of the contralateral lobe which is increased risk of complications or problems provided no additional benefit.   We placed an 29 French suprapubic tube in a standard manner utilizing a Lowsley prostatic retractor. Good positioning was confirmed with cystoscopy. A 24 French catheter was then placed in the bladder after removal of all the prostate chips which will be sent for pathologic analysis. Hemostasis at that point was light pain. The patient no obvious complications or problems. The ureteral catheter was placed on light traction. He was brought to recovery in stable condition. No obvious complications occurred.

## 2013-04-24 ENCOUNTER — Encounter (HOSPITAL_COMMUNITY): Payer: Self-pay | Admitting: Urology

## 2013-04-24 LAB — BASIC METABOLIC PANEL
CO2: 28 mEq/L (ref 19–32)
Chloride: 105 mEq/L (ref 96–112)
Creatinine, Ser: 1.06 mg/dL (ref 0.50–1.35)
GFR calc Af Amer: 76 mL/min — ABNORMAL LOW (ref >90)
Potassium: 4.6 mEq/L (ref 3.5–5.1)
Sodium: 137 mEq/L (ref 135–145)

## 2013-04-24 LAB — HEMOGLOBIN AND HEMATOCRIT, BLOOD
HCT: 37.5 % — ABNORMAL LOW (ref 39.0–52.0)
Hemoglobin: 12.4 g/dL — ABNORMAL LOW (ref 13.0–17.0)

## 2013-04-24 MED ORDER — HYDROCODONE-ACETAMINOPHEN 5-325 MG PO TABS: 1.0000 | ORAL_TABLET | Freq: Four times a day (QID) | ORAL | Status: DC | PRN

## 2013-04-24 MED ORDER — VITAMINS A & D EX OINT
TOPICAL_OINTMENT | CUTANEOUS | Status: AC
  Administered 2013-04-24: 5
  Filled 2013-04-24: qty 5

## 2013-04-24 NOTE — Discharge Summary (Signed)
Physician Discharge Summary  Patient ID: Glenn Mendez MRN: [id number] DOB/AGE: 1931/09/21 79 y.o.  Admit date: 04/23/2013 Discharge date: 04/24/2013  Admission Diagnoses:  Discharge Diagnoses:  Active Problems:   * No active hospital problems. *   Discharged Condition: good  Hospital Course: Patient was admitted for postop observation/care status post TURP. His operative note will outline his urologic history leading up to urinary retention and need for TURP. The patient underwent a TURP procedure with placement of a concomitant suprapubic tube. Urine remained light pink in color with some continuous bladder irrigation. No complications or problems overnight. Postoperative labs all were within normal limits.  Consults: None  Significant Diagnostic Studies: Routine blood work preoperatively and postoperative day 1  Treatments: surgery: TURP with suprapubic tube placement  Discharge Exam: Blood pressure 143/65, pulse 56, temperature 98 F (36.7 C), temperature source Oral, resp. rate 18, height 5\' 6"  (1.676 m), weight 64.864 kg (143 lb), SpO2 100.00%. General appearance: alert and cooperative Respiratory normal effort. Cardiac regular rate and rhythm. Abdomen is soft and nontender with suprapubic tube in place. No change in genitourinary or extremity exam.  Disposition: 01-Home or Self Care     Medication List    STOP taking these medications       aspirin 81 MG tablet      TAKE these medications       folic acid 400 MCG tablet  Commonly known as:  FOLVITE  Take 400 mcg by mouth daily.     HYDROcodone-acetaminophen 5-325 MG per tablet  Commonly known as:  NORCO/VICODIN  Take 1-2 tablets by mouth every 6 (six) hours as needed for pain.     tamsulosin 0.4 MG Caps capsule  Commonly known as:  FLOMAX  Take 0.4 mg by mouth daily.           Follow-up Information   Please follow up. (Appointment in our office tomorrow for catheter removal)        Signed: Ashwath Lasch S 04/24/2013, 7:47 AM

## 2014-05-27 ENCOUNTER — Encounter: Payer: Self-pay | Admitting: Internal Medicine

## 2014-09-09 ENCOUNTER — Encounter: Payer: Self-pay | Admitting: Internal Medicine

## 2015-03-23 ENCOUNTER — Encounter: Payer: Self-pay | Admitting: Internal Medicine

## 2015-03-23 ENCOUNTER — Ambulatory Visit (INDEPENDENT_AMBULATORY_CARE_PROVIDER_SITE_OTHER): Payer: Medicare Other | Admitting: Internal Medicine

## 2015-03-23 VITALS — BP 126/78 | HR 80 | Ht 66.0 in | Wt 146.4 lb

## 2015-03-23 DIAGNOSIS — Z86010 Personal history of colonic polyps: Secondary | ICD-10-CM | POA: Diagnosis not present

## 2015-03-23 NOTE — Patient Instructions (Signed)
  Per our discussion today you do not need to pursue routine colonoscopy.    Follow up with Dr. Leone PayorGessner as needed.    I appreciate the opportunity to care for you. Stan Headarl Gessner, MD, [facility]

## 2015-03-23 NOTE — Progress Notes (Signed)
   Subjective:    Patient ID: Glenn Mendez, male    DOB: 1932/08/30, 81 y.o.   MRN: [id number] Cc: hx colon polyps   HPI Very nice elderly wm with hx hyperplastic right colon polyp and one on left 5 yrs ago. Here to discuss surveillance colonoscopy. He is well w/o GI c/o and says had hemoccults last year that were negative. Medications, allergies, past medical history, past surgical history, family history and social history are reviewed and updated in the EMR.   Review of Systems As above    Objective:   Physical Exam BP 126/78 mmHg  Pulse 80  Ht 5\' 6"  (1.676 m)  Wt 146 lb 6.4 oz (66.407 kg)  BMI 23.64 kg/m2 WDWN elderly wm - younger than stated     Assessment & Plan:  History of colonic polyps  We have decided not to pursue repeat routine colonoscopy and investigate signs and sxs as necessary.  I appreciate the opportunity to care for this patient. WU:[id number]:HARRIS, Chrissie NoaWILLIAM, MD

## 2020-05-05 ENCOUNTER — Other Ambulatory Visit: Payer: Self-pay | Admitting: Family Medicine

## 2020-05-05 ENCOUNTER — Other Ambulatory Visit: Payer: Self-pay

## 2020-05-05 ENCOUNTER — Ambulatory Visit
Admission: RE | Admit: 2020-05-05 | Discharge: 2020-05-05 | Disposition: A | Discharge status: Home / Self Care | Payer: Medicare Other | Source: Ambulatory Visit | Attending: Family Medicine | Admitting: Family Medicine

## 2020-05-05 DIAGNOSIS — M79672 Pain in left foot: Secondary | ICD-10-CM

## 2020-05-05 IMAGING — CR DG FOOT COMPLETE 3+V*L*
3 series · 3 of 3 positions shown · non-contrast
Comparison: None.

CLINICAL DATA: Left foot pain after fall 2-3 days ago. Left foot
injury.

EXAM:
LEFT FOOT - COMPLETE 3+ VIEW

[x foot ap left (1 of 2)]
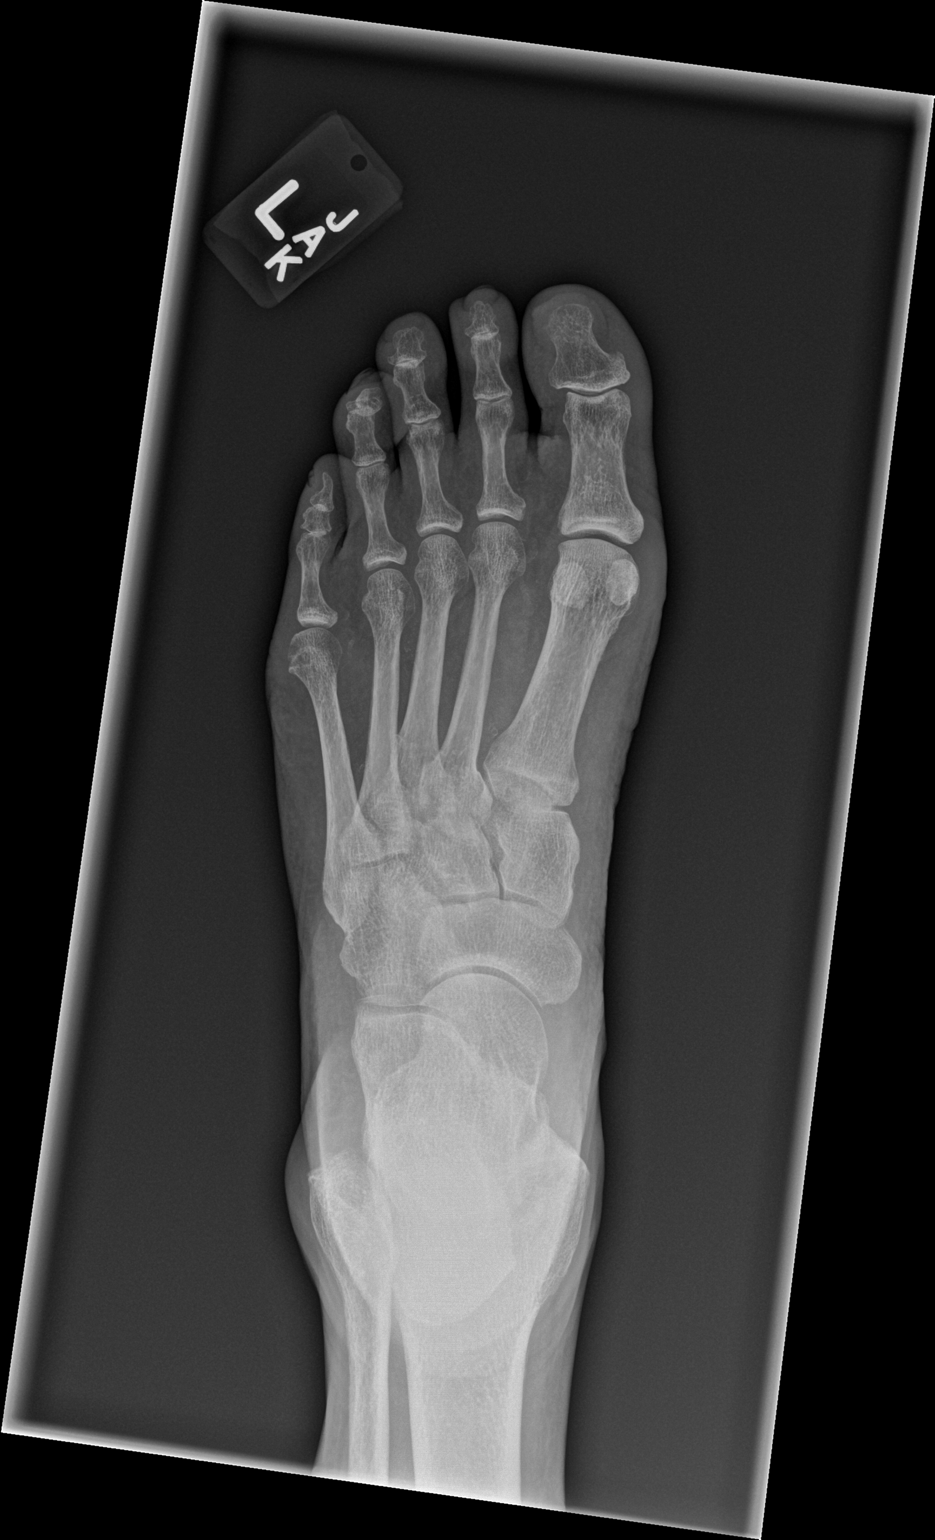

[x foot ap left (2 of 2)]
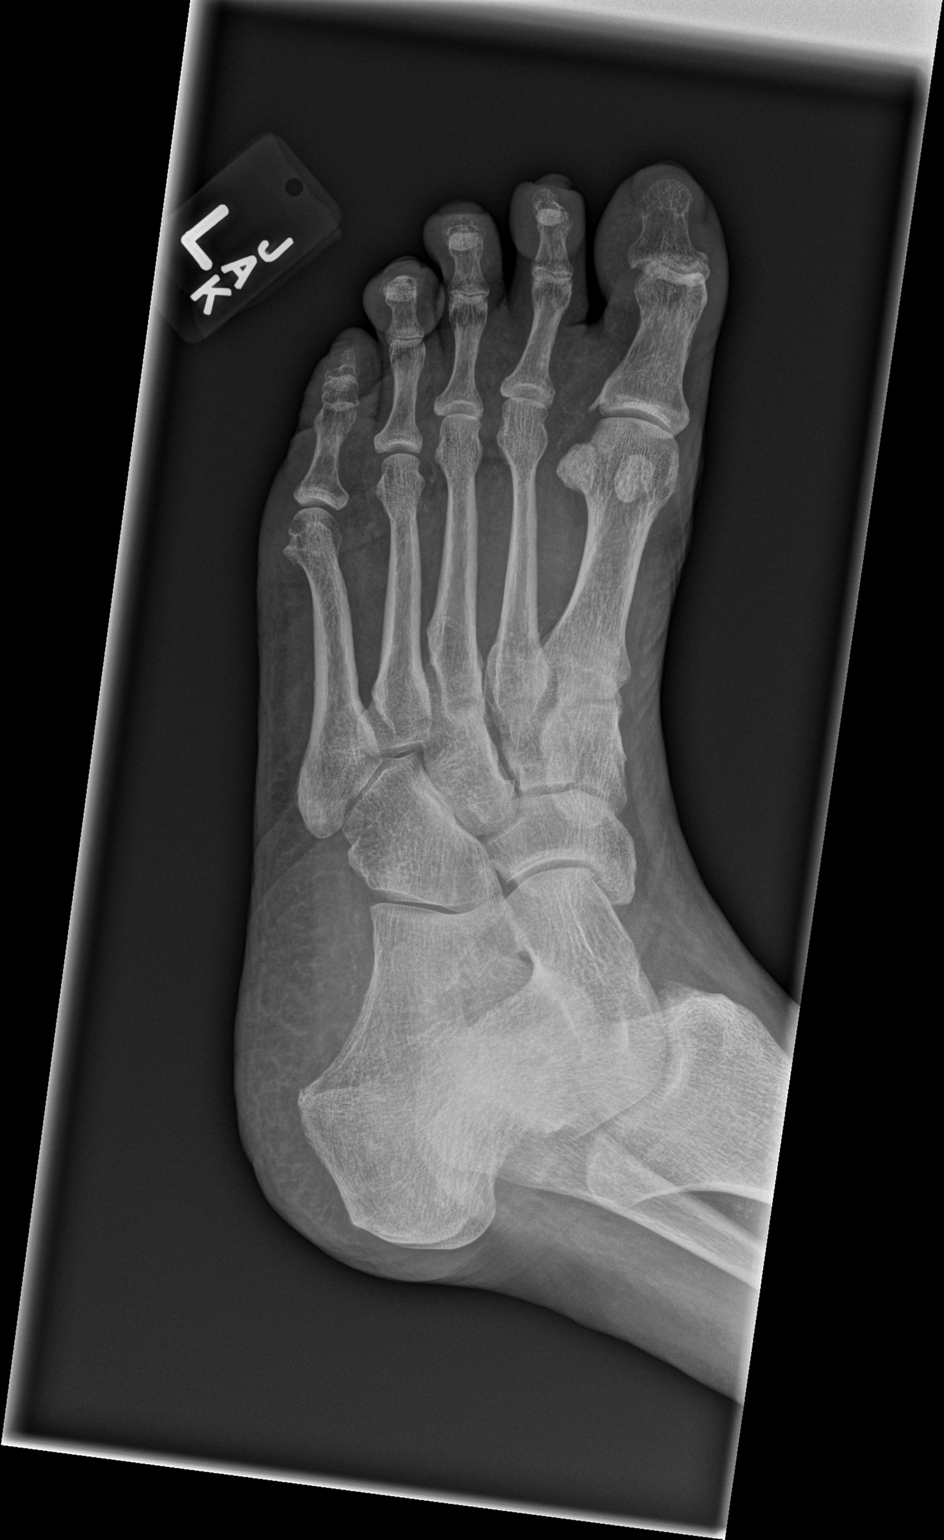

[x foot lat left]
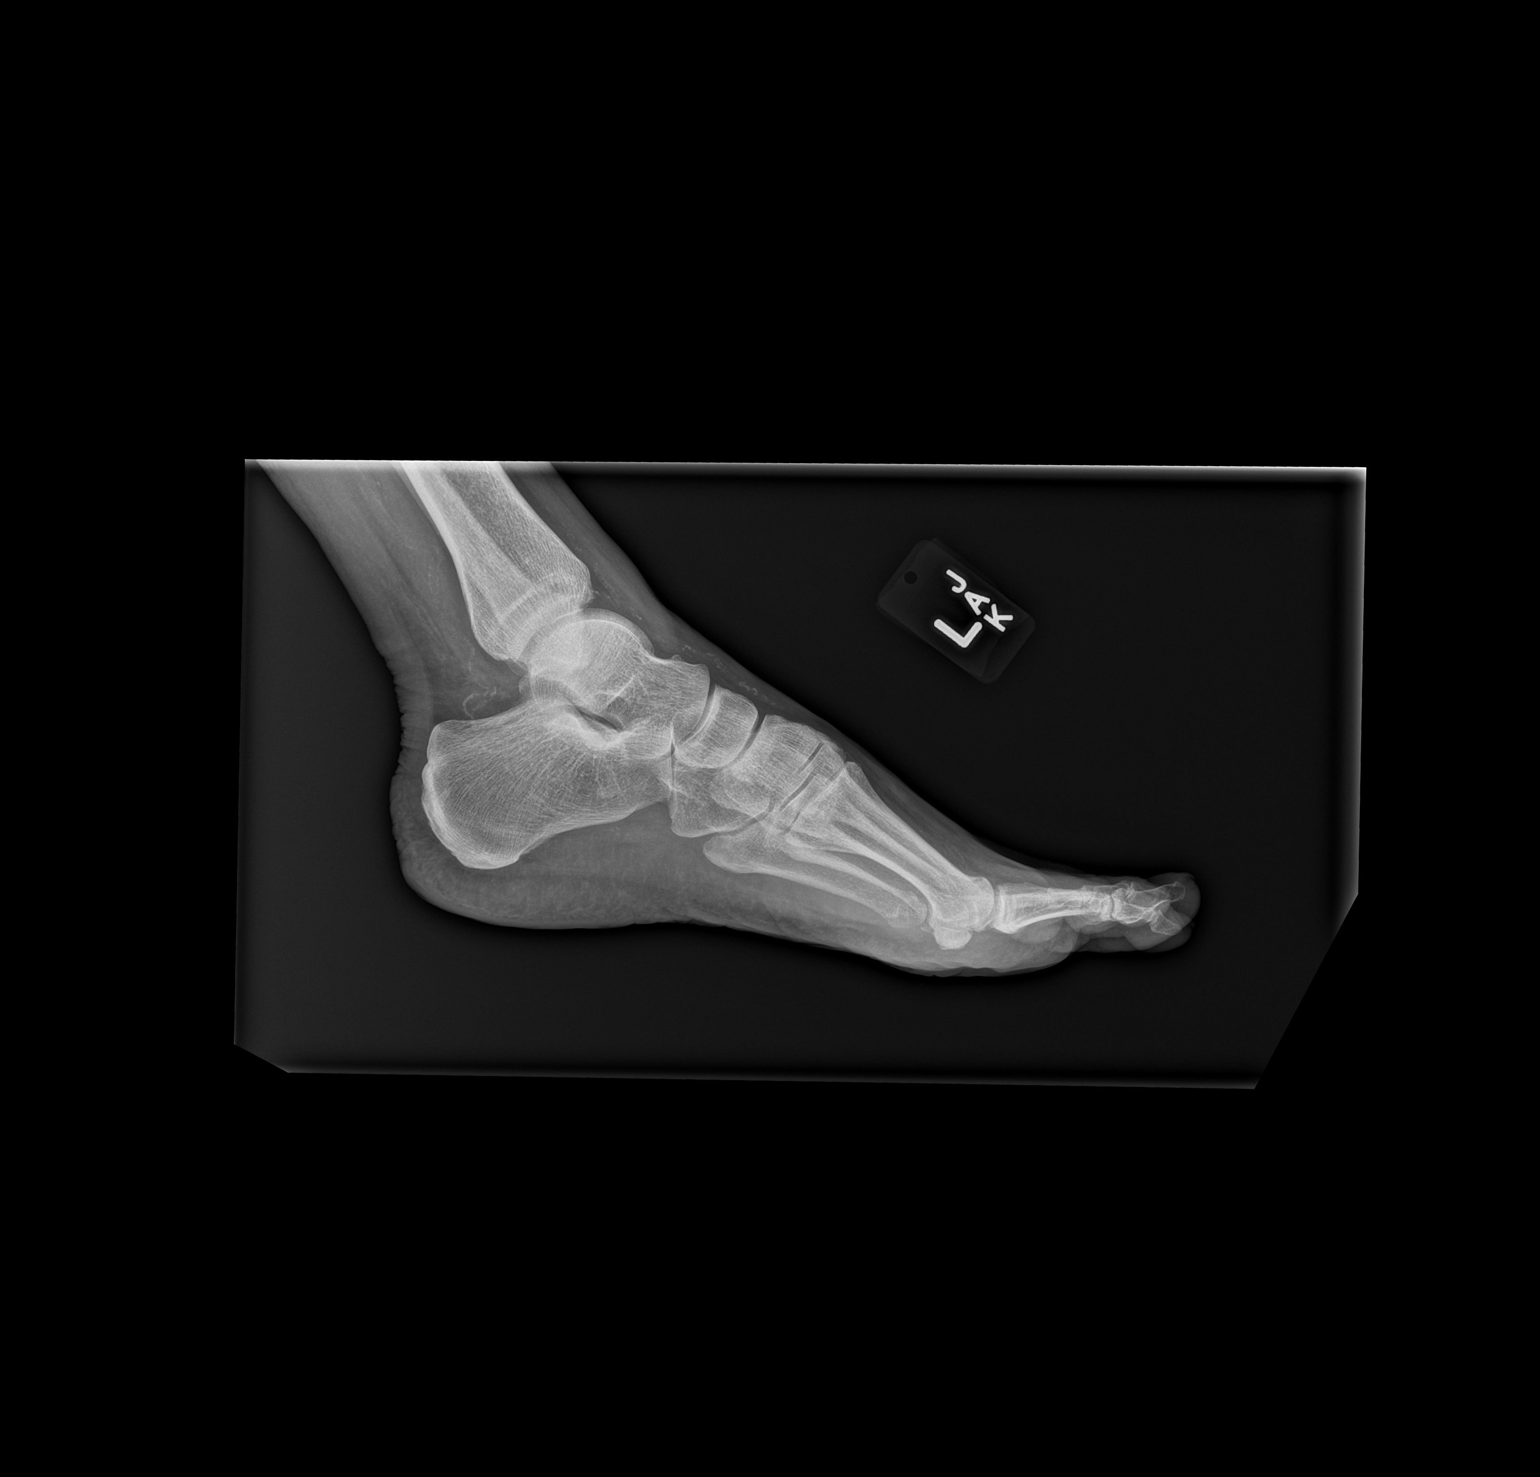

[3 of 3 positions shown; findings below may reference images not displayed]

FINDINGS: There is no evidence of fracture or dislocation. Periarticular
erosion involving the fifth metatarsal head. No other erosive
changes. No periosteal reaction. There are vascular calcifications.
IMPRESSION: 1. No fracture or subluxation of the left foot.
2. Periarticular erosion involving the fifth metatarsal head,
suspicious for inflammatory arthropathy such is gout.

## 2020-05-06 ENCOUNTER — Other Ambulatory Visit: Payer: Self-pay | Admitting: Family Medicine

## 2020-05-06 DIAGNOSIS — R29898 Other symptoms and signs involving the musculoskeletal system: Secondary | ICD-10-CM

## 2020-05-07 ENCOUNTER — Ambulatory Visit
Admission: RE | Admit: 2020-05-07 | Discharge: 2020-05-07 | Disposition: A | Discharge status: Home / Self Care | Payer: Medicare Other | Source: Ambulatory Visit | Attending: Family Medicine | Admitting: Family Medicine

## 2020-05-07 ENCOUNTER — Other Ambulatory Visit: Payer: Self-pay | Admitting: Family Medicine

## 2020-05-07 ENCOUNTER — Other Ambulatory Visit: Payer: Self-pay

## 2020-05-07 DIAGNOSIS — R531 Weakness: Secondary | ICD-10-CM

## 2020-05-07 IMAGING — CT CT HEAD W/O CM
1 series · 16 of 30 positions shown, 20 images · non-contrast
Comparison: None.

CLINICAL DATA: Left-sided weakness.

EXAM:
CT HEAD WITHOUT CONTRAST
TECHNIQUE: Contiguous axial images were obtained from the base of the skull
through the vertex without intravenous contrast.

[Series 2: head w/(date) · axial · 0.47mm/px · z∈[-206,-56]mm · 16 of 34 slices shown, 20 images]
[im 2/34  brain]
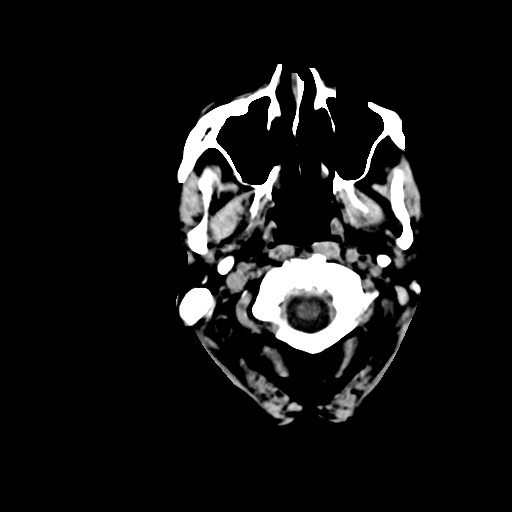
[im 2/34  bone]
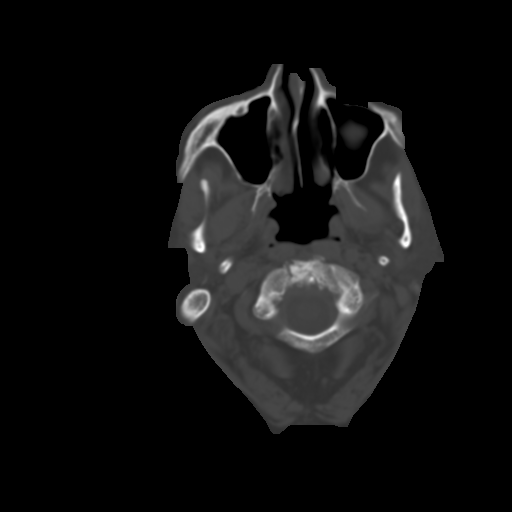
[im 4/34  brain]
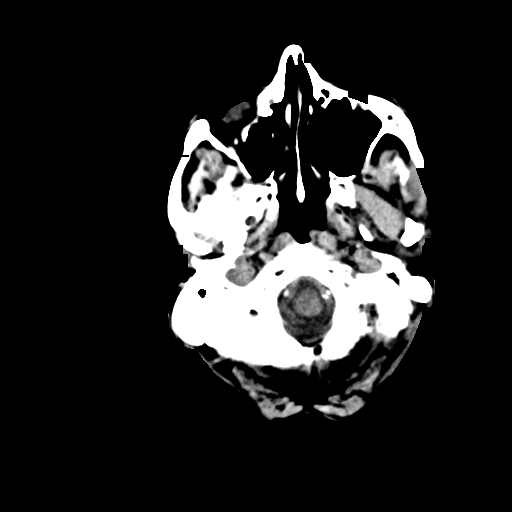
[im 6/34  brain]
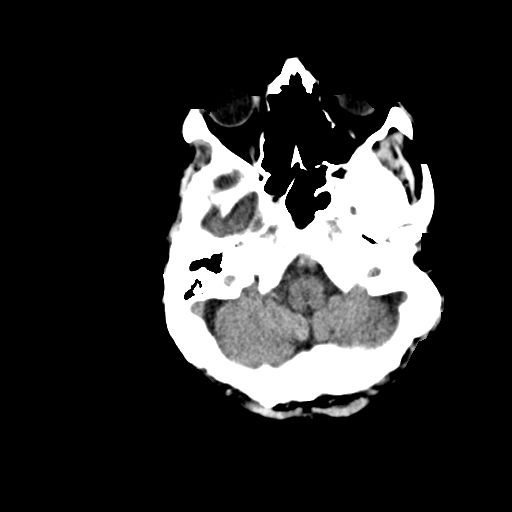
[im 8/34  brain]
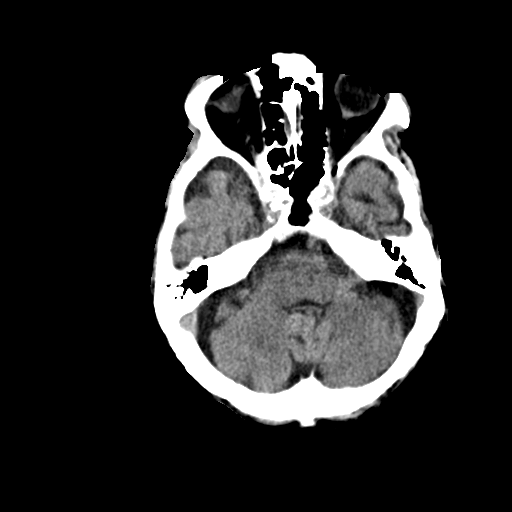
[im 10/34  brain]
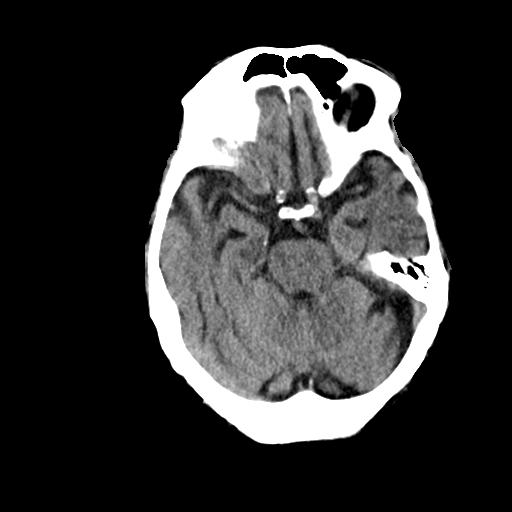
[im 10/34  bone]
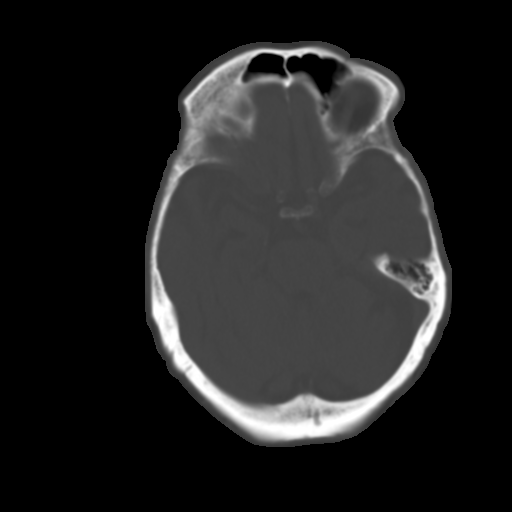
[im 12/34  brain]
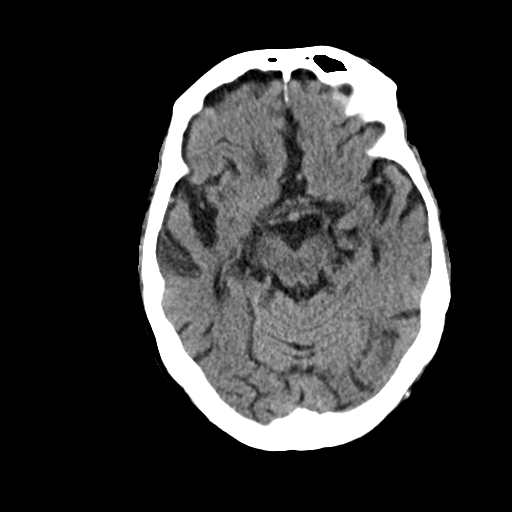
[im 14/34  brain]
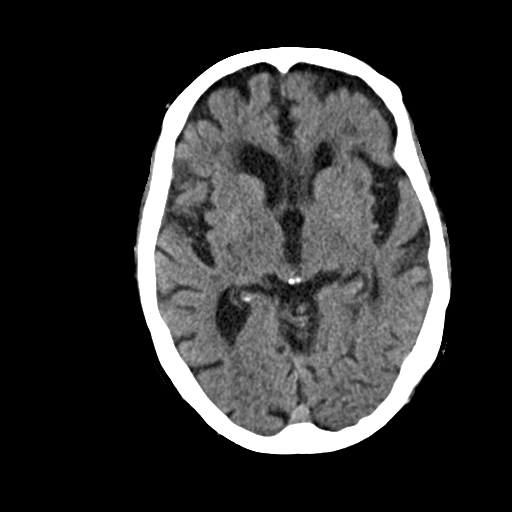
[im 16/34  brain]
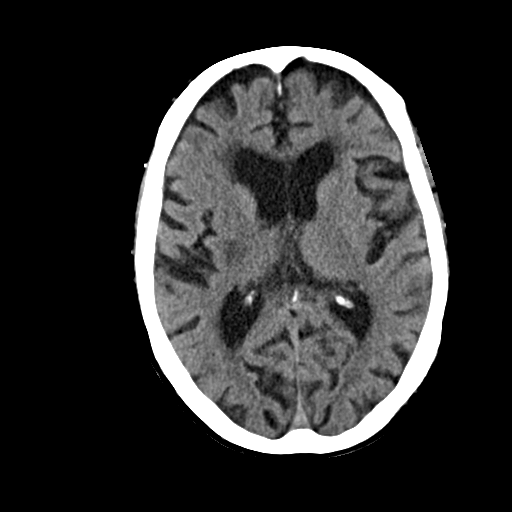
[im 18/34  brain]
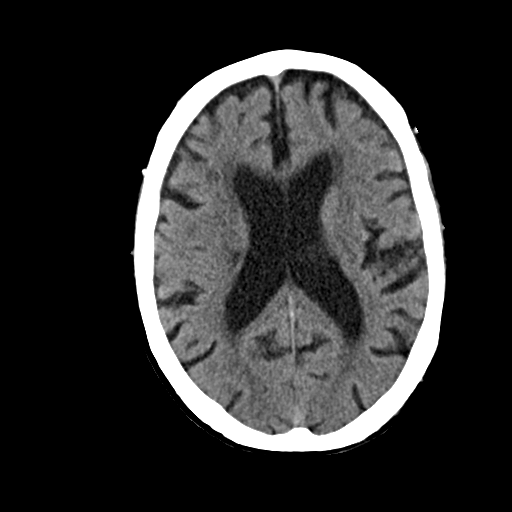
[im 18/34  bone]
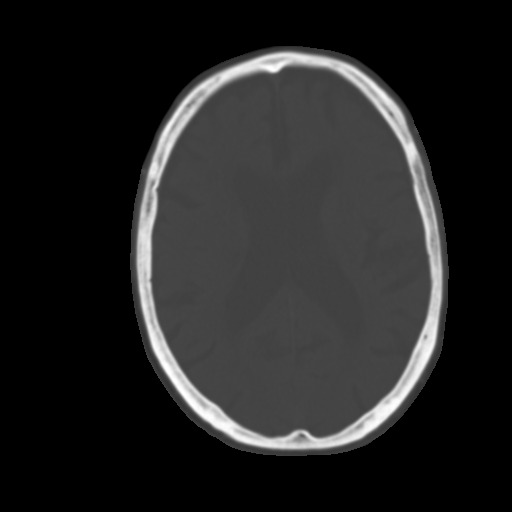
[im 20/34  brain]
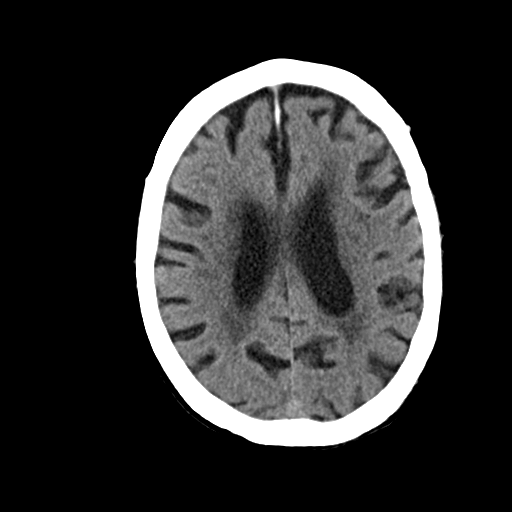
[im 22/34  brain]
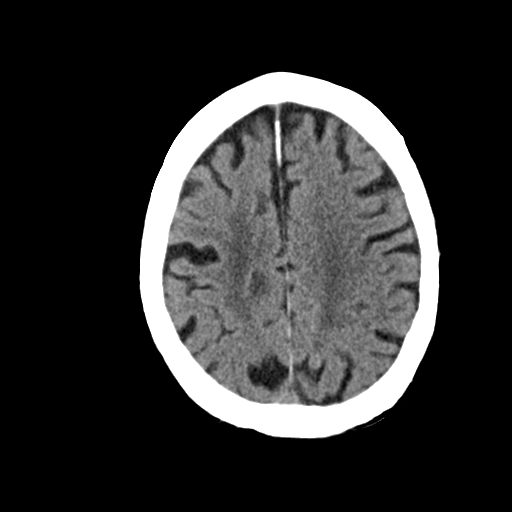
[im 24/34  brain]
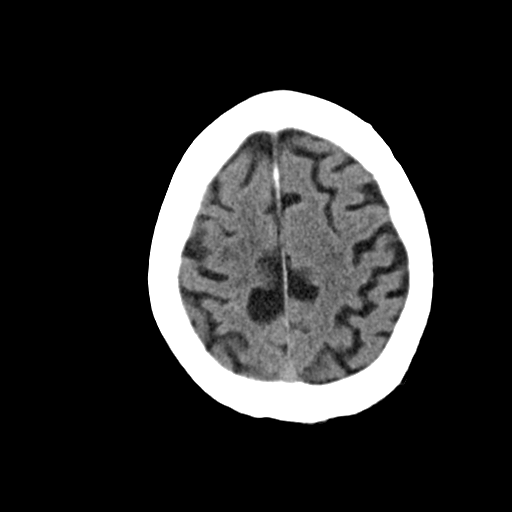
[im 26/34  brain]
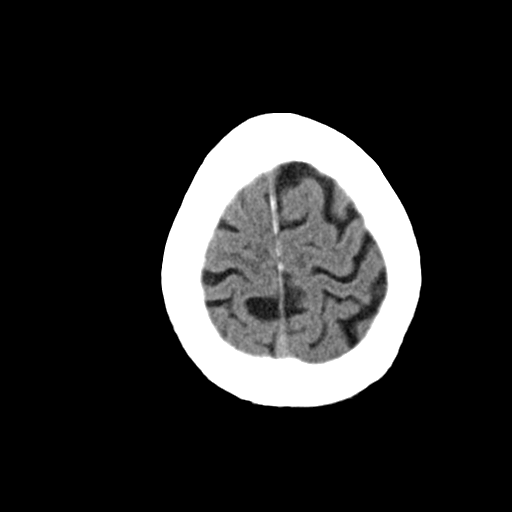
[im 26/34  bone]
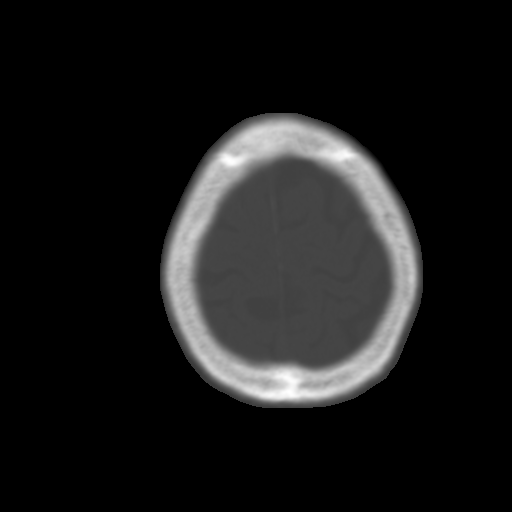
[im 28/34  brain]
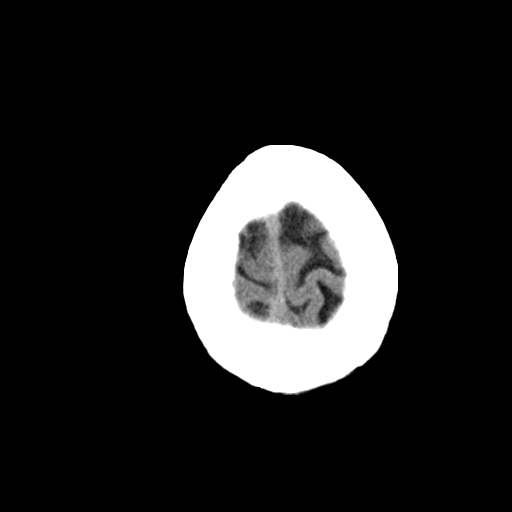
[im 30/34  brain]
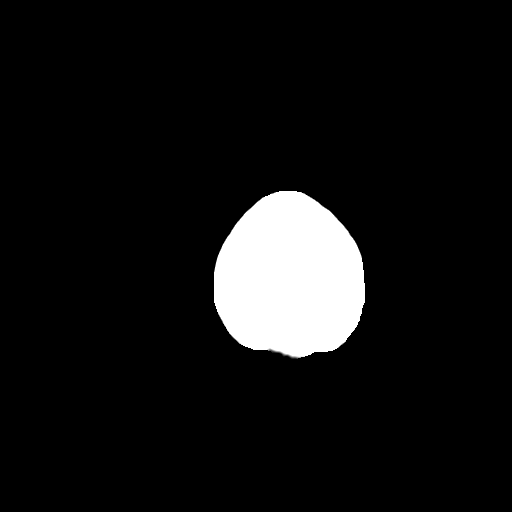
[im 32/34  brain]
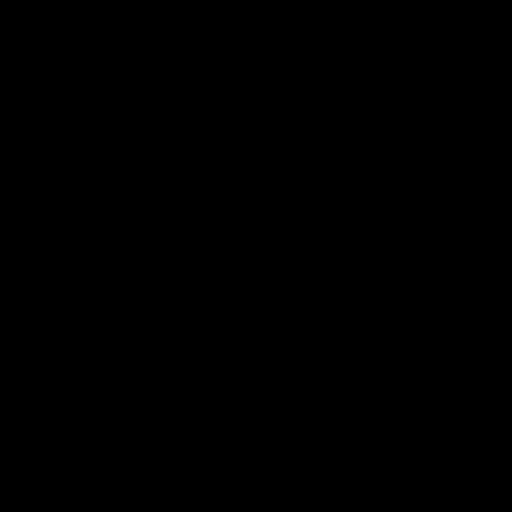

[16 of 30 positions shown; findings below may reference images not displayed]

FINDINGS: Brain: Mild diffuse cortical atrophy is noted. Mild chronic ischemic
white matter disease is noted. No mass effect or midline shift is
noted. Ventricular size is within normal limits. There is no
evidence of mass lesion, hemorrhage or acute infarction.

Vascular: No hyperdense vessel or unexpected calcification.

Skull: Normal. Negative for fracture or focal lesion.

Sinuses/Orbits: Left maxillary mucous retention cyst is noted.

Other: None.
IMPRESSION: Mild diffuse cortical atrophy. Mild chronic ischemic white matter
disease. No acute intracranial abnormality seen.

## 2020-05-11 ENCOUNTER — Other Ambulatory Visit: Payer: Self-pay | Admitting: Family Medicine

## 2020-05-11 DIAGNOSIS — R531 Weakness: Secondary | ICD-10-CM

## 2020-05-13 ENCOUNTER — Ambulatory Visit
Admission: RE | Admit: 2020-05-13 | Discharge: 2020-05-13 | Disposition: A | Discharge status: Home / Self Care | Payer: Medicare Other | Source: Ambulatory Visit | Attending: Family Medicine | Admitting: Family Medicine

## 2020-05-13 DIAGNOSIS — R531 Weakness: Secondary | ICD-10-CM

## 2020-05-13 IMAGING — MR MR MRA NECK W/O CM
1 of 2 series · 18 of 48 positions shown · non-contrast
Comparison: [DATE] head CT.
COMPARISON: [DATE] head CT.

Addendum:
CLINICAL DATA: Weakness

EXAM:
MR HEAD WITHOUT CONTRAST
MR CIRCLE OF WILLIS WITHOUT CONTRAST
MRA OF THE NECK WITHOUT CONTRAST
TECHNIQUE: Multiplanar, multiecho pulse sequences of the brain, circle of
willis and surrounding structures were obtained without intravenous
contrast. Angiographic images of the neck were obtained using MRA
technique without intravenous contrast.

[Series 16: [id number]_multi-slab · axial · 0.8mm · 0.52mm/px · z∈[-235,-114]mm · 18 of 157 slices shown]
[im 1/157]
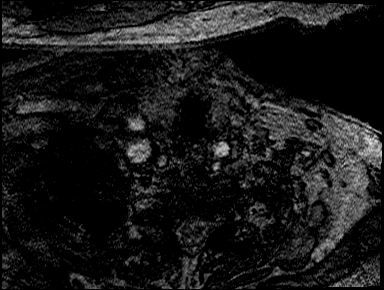
[im 10/157]
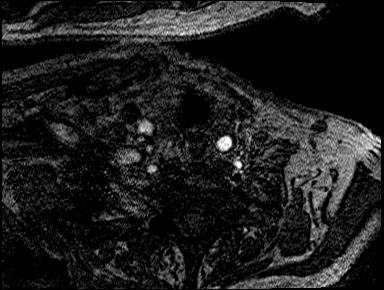
[im 19/157]
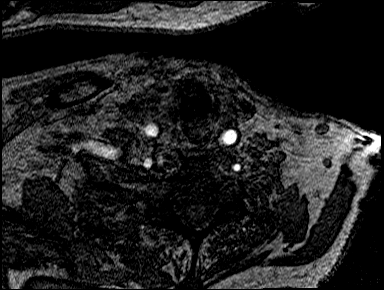
[im 28/157]
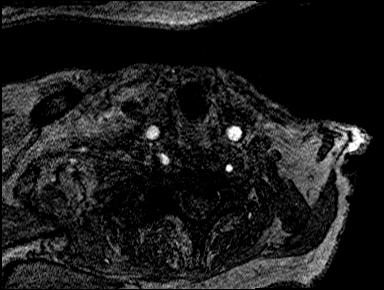
[im 37/157]
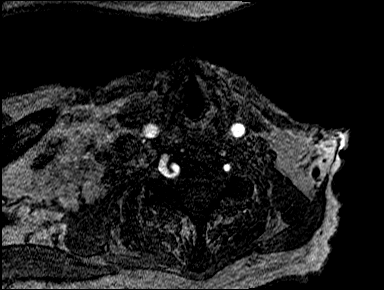
[im 46/157]
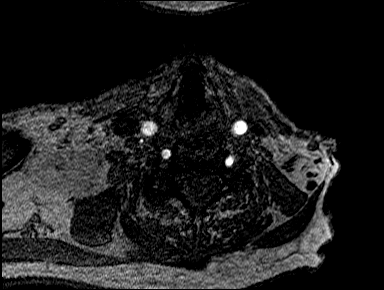
[im 56/157]
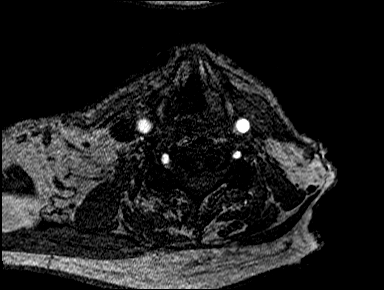
[im 65/157]
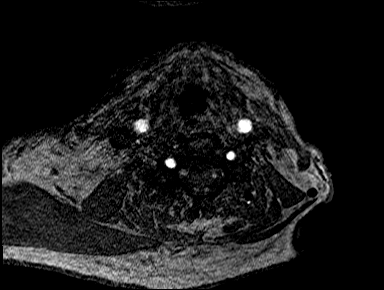
[im 74/157]
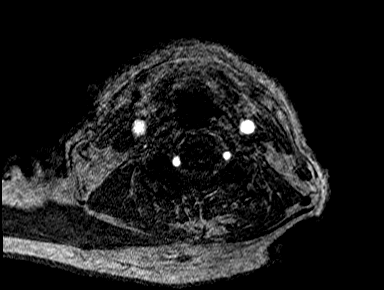
[im 83/157]
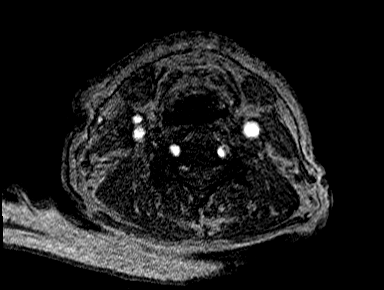
[im 92/157]
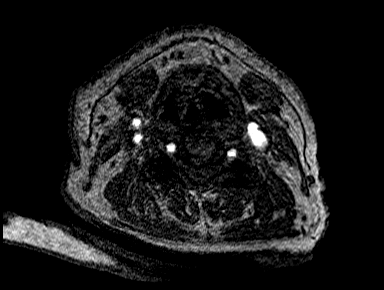
[im 101/157]
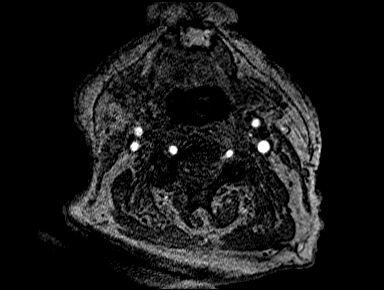
[im 111/157]
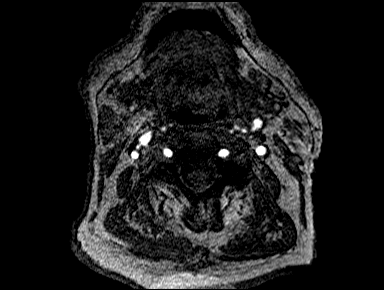
[im 120/157]
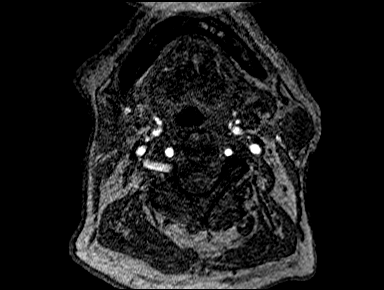
[im 129/157]
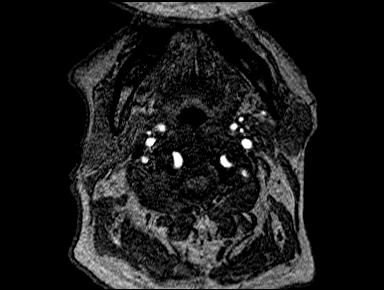
[im 138/157]
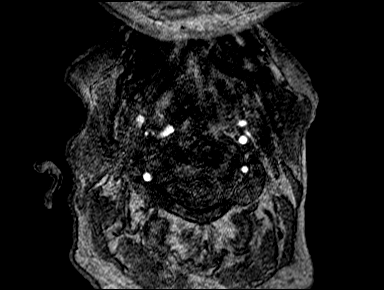
[im 147/157]
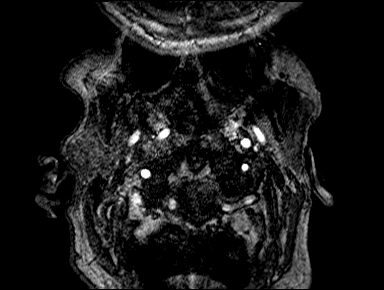
[im 157/157]
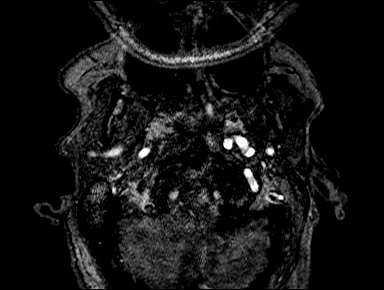

[18 of 48 positions shown; findings below may reference images not displayed]

FINDINGS: MR HEAD FINDINGS

Brain: Focal restricted diffusion involving the dorsal limb of the
right internal capsule. No intracranial hemorrhage. No midline
shift, ventriculomegaly or extra-axial fluid collection. No mass
lesion. Diffuse parenchymal volume loss with ex vacuo dilatation.
Moderate chronic microvascular ischemic changes.

Vascular: Please see MRA.

Skull and upper cervical spine: Normal marrow signal.

Sinuses/Orbits: Normal orbits. Sequela of right lens replacement.
Mild ethmoid and maxillary sinus disease. No mastoid effusion.

Other: None.

MR CIRCLE OF WILLIS FINDINGS

Anterior circulation: Right A1 segment aplasia. Otherwise normal
appearance of the anterior cerebral arteries. Normal appearance of
the petrous and intracavernous segments of the ICAs. Comparatively
diminished signal involving the right

paraclinoid ICA ([DATE]), right M2 and M3 branches. Mild
narrowing/irregularity of the proximal right M1 segment may reflect
atherosclerotic disease ([DATE]). Normal appearance of the left MCA.

Bilateral PCOM hypoplasia. No significant stenosis, aneurysm, or
vascular malformation.

Posterior circulation: Patent, normal caliber appearance of the
posterior cerebral, superior cerebellar and basilar arteries.
Dominant left AICA. Patent V3/V4 segments. No significant stenosis,
proximal occlusion, aneurysm, or vascular malformation.

Venous sinuses: Patent

Anatomic variants: As detailed above.

MRA NECK FINDINGS

There is no high-grade narrowing or focal aneurysm involving the
bilateral carotid arteries. No evidence of dissection. Mild
tortuosity of the cervical segments.

The bilateral vertebral arteries are patent and demonstrate
antegrade flow. Dominant right vertebral artery. No evidence of
high-grade narrowing or focal aneurysm.

Three vessel aortic arch.
IMPRESSION: Acute infarct involving the dorsal limb of the right internal
capsule.

Moderate cerebral atrophy and chronic microvascular ischemic
changes.

Slow flow involving the right paraclinoid ICA, right M2/M3 segments.

Mild right M1 segment narrowing/irregularity likely reflects
atheromatous disease.

No large vessel occlusion, high-grade narrowing or aneurysm.

ADDENDUM:
These results were called by telephone at the time of interpretation
on [DATE] at [DATE] to provider RANTONA , who verbally
acknowledged these results.

*** End of Addendum ***
FINDINGS: MR HEAD FINDINGS

Brain: Focal restricted diffusion involving the dorsal limb of the
right internal capsule. No intracranial hemorrhage. No midline
shift, ventriculomegaly or extra-axial fluid collection. No mass
lesion. Diffuse parenchymal volume loss with ex vacuo dilatation.
Moderate chronic microvascular ischemic changes.

Vascular: Please see MRA.

Skull and upper cervical spine: Normal marrow signal.

Sinuses/Orbits: Normal orbits. Sequela of right lens replacement.
Mild ethmoid and maxillary sinus disease. No mastoid effusion.

Other: None.

MR CIRCLE OF WILLIS FINDINGS

Anterior circulation: Right A1 segment aplasia. Otherwise normal
appearance of the anterior cerebral arteries. Normal appearance of
the petrous and intracavernous segments of the ICAs. Comparatively
diminished signal involving the right

paraclinoid ICA ([DATE]), right M2 and M3 branches. Mild
narrowing/irregularity of the proximal right M1 segment may reflect
atherosclerotic disease ([DATE]). Normal appearance of the left MCA.

Bilateral PCOM hypoplasia. No significant stenosis, aneurysm, or
vascular malformation.

Posterior circulation: Patent, normal caliber appearance of the
posterior cerebral, superior cerebellar and basilar arteries.
Dominant left AICA. Patent V3/V4 segments. No significant stenosis,
proximal occlusion, aneurysm, or vascular malformation.

Venous sinuses: Patent

Anatomic variants: As detailed above.

MRA NECK FINDINGS

There is no high-grade narrowing or focal aneurysm involving the
bilateral carotid arteries. No evidence of dissection. Mild
tortuosity of the cervical segments.

The bilateral vertebral arteries are patent and demonstrate
antegrade flow. Dominant right vertebral artery. No evidence of
high-grade narrowing or focal aneurysm.

Three vessel aortic arch.
IMPRESSION: Acute infarct involving the dorsal limb of the right internal
capsule.

Moderate cerebral atrophy and chronic microvascular ischemic
changes.

Slow flow involving the right paraclinoid ICA, right M2/M3 segments.

Mild right M1 segment narrowing/irregularity likely reflects
atheromatous disease.

No large vessel occlusion, high-grade narrowing or aneurysm.

## 2020-05-13 IMAGING — MR MR MRA HEAD W/O CM
1 series · 9 of 48 positions shown · non-contrast
Comparison: [DATE] head CT.
COMPARISON: [DATE] head CT.

Addendum:
CLINICAL DATA: Weakness

EXAM:
MR HEAD WITHOUT CONTRAST
MR CIRCLE OF WILLIS WITHOUT CONTRAST
MRA OF THE NECK WITHOUT CONTRAST
TECHNIQUE: Multiplanar, multiecho pulse sequences of the brain, circle of
willis and surrounding structures were obtained without intravenous
contrast. Angiographic images of the neck were obtained using MRA
technique without intravenous contrast.

[Series 5: [id number]-[id number] · axial · 0.6mm · 0.26mm/px · z∈[-89,-2]mm · 9 of 188 slices shown]
[im 12/188]
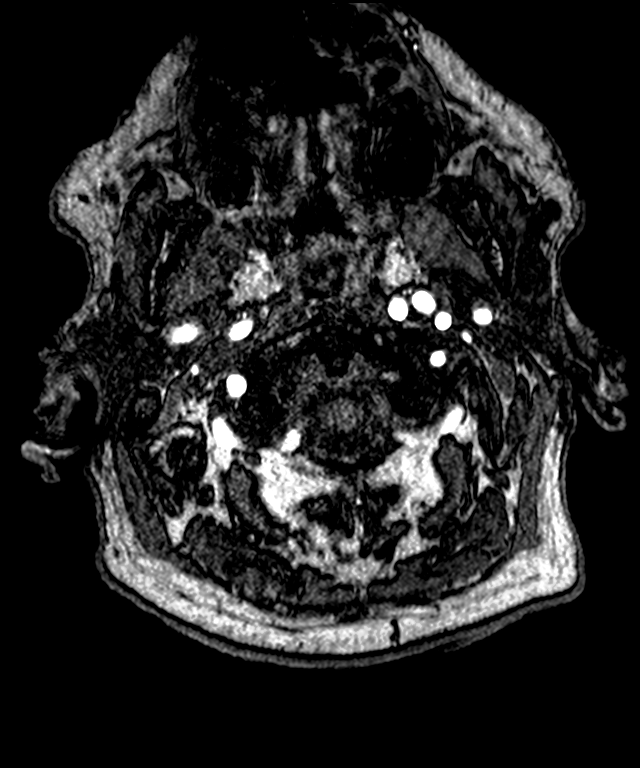
[im 32/188]
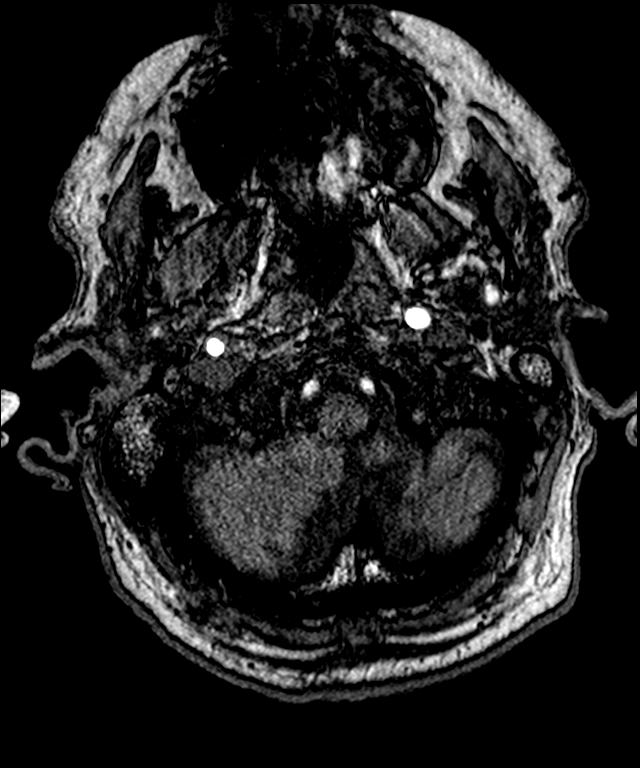
[im 36/188]
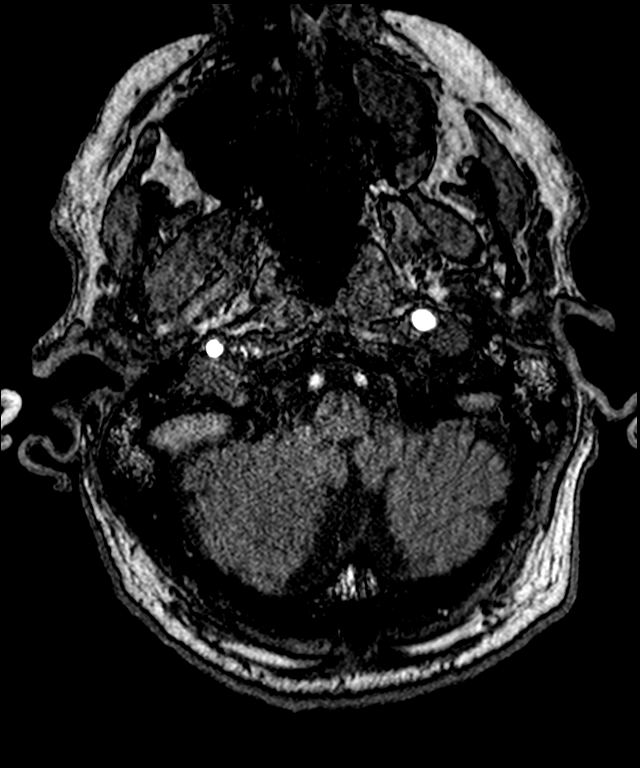
[im 60/188]
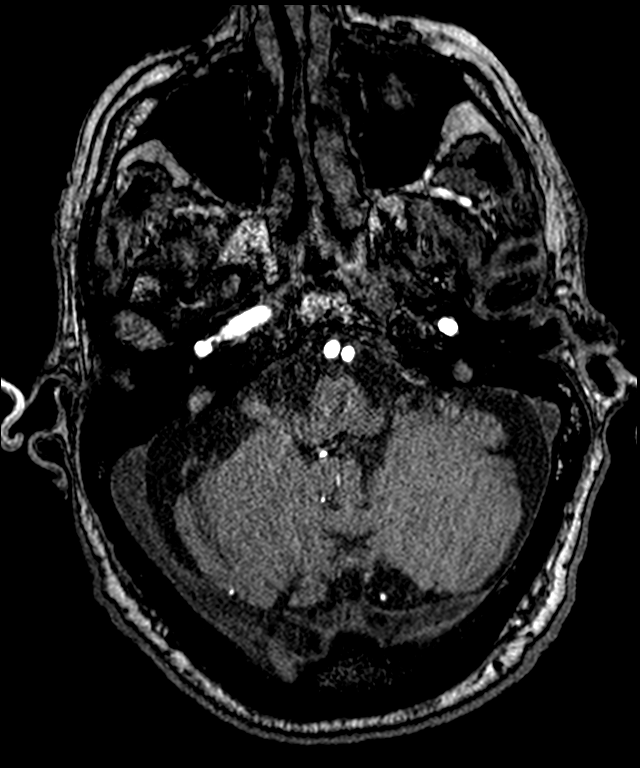
[im 84/188]
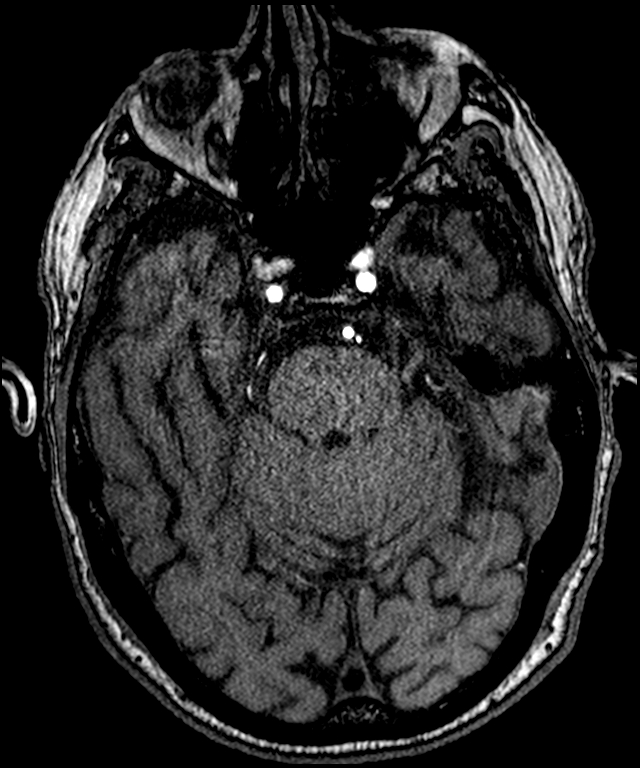
[im 96/188]
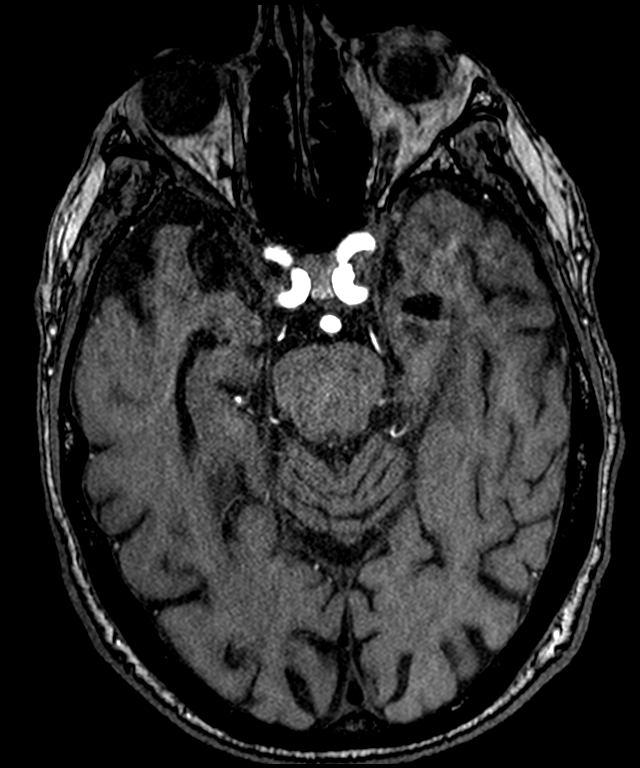
[im 108/188]
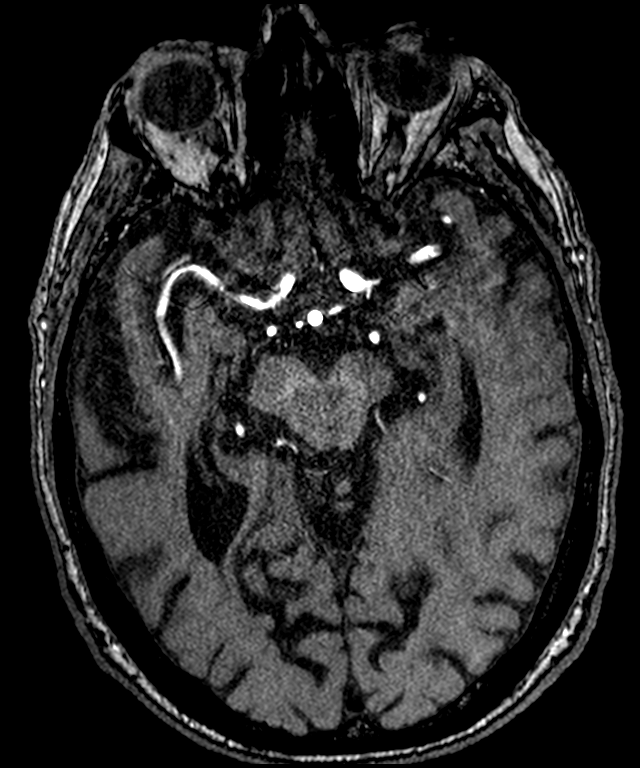
[im 132/188]
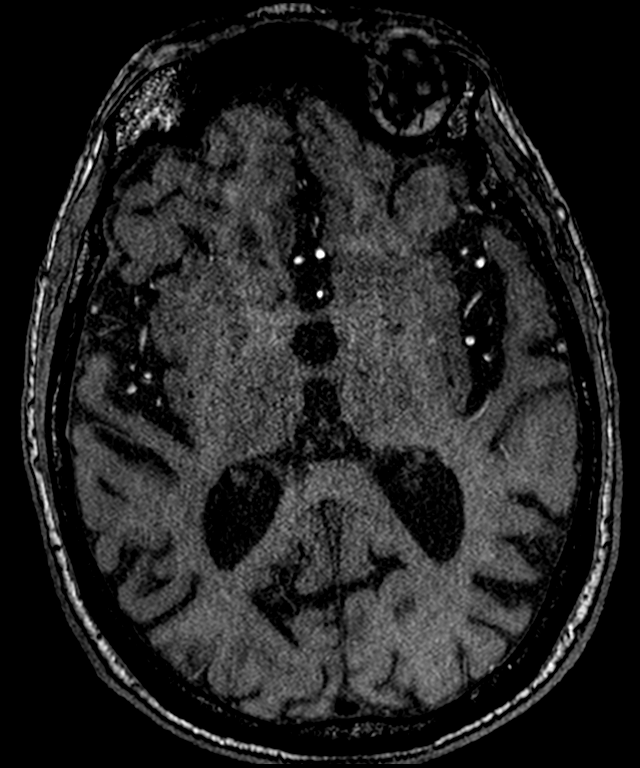
[im 160/188]
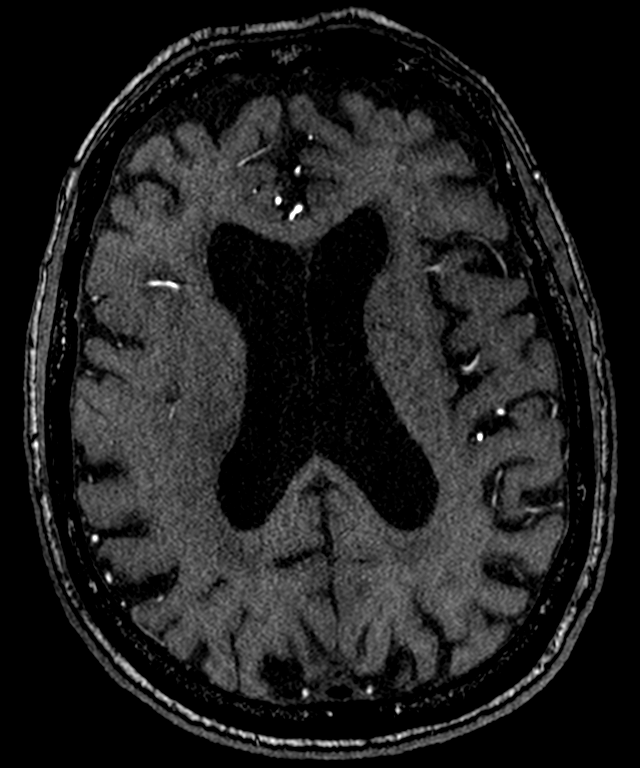

[9 of 48 positions shown; findings below may reference images not displayed]

FINDINGS: MR HEAD FINDINGS

Brain: Focal restricted diffusion involving the dorsal limb of the
right internal capsule. No intracranial hemorrhage. No midline
shift, ventriculomegaly or extra-axial fluid collection. No mass
lesion. Diffuse parenchymal volume loss with ex vacuo dilatation.
Moderate chronic microvascular ischemic changes.

Vascular: Please see MRA.

Skull and upper cervical spine: Normal marrow signal.

Sinuses/Orbits: Normal orbits. Sequela of right lens replacement.
Mild ethmoid and maxillary sinus disease. No mastoid effusion.

Other: None.

MR CIRCLE OF WILLIS FINDINGS

Anterior circulation: Right A1 segment aplasia. Otherwise normal
appearance of the anterior cerebral arteries. Normal appearance of
the petrous and intracavernous segments of the ICAs. Comparatively
diminished signal involving the right

paraclinoid ICA ([DATE]), right M2 and M3 branches. Mild
narrowing/irregularity of the proximal right M1 segment may reflect
atherosclerotic disease ([DATE]). Normal appearance of the left MCA.

Bilateral PCOM hypoplasia. No significant stenosis, aneurysm, or
vascular malformation.

Posterior circulation: Patent, normal caliber appearance of the
posterior cerebral, superior cerebellar and basilar arteries.
Dominant left AICA. Patent V3/V4 segments. No significant stenosis,
proximal occlusion, aneurysm, or vascular malformation.

Venous sinuses: Patent

Anatomic variants: As detailed above.

MRA NECK FINDINGS

There is no high-grade narrowing or focal aneurysm involving the
bilateral carotid arteries. No evidence of dissection. Mild
tortuosity of the cervical segments.

The bilateral vertebral arteries are patent and demonstrate
antegrade flow. Dominant right vertebral artery. No evidence of
high-grade narrowing or focal aneurysm.

Three vessel aortic arch.
IMPRESSION: Acute infarct involving the dorsal limb of the right internal
capsule.

Moderate cerebral atrophy and chronic microvascular ischemic
changes.

Slow flow involving the right paraclinoid ICA, right M2/M3 segments.

Mild right M1 segment narrowing/irregularity likely reflects
atheromatous disease.

No large vessel occlusion, high-grade narrowing or aneurysm.

ADDENDUM:
These results were called by telephone at the time of interpretation
on [DATE] at [DATE] to provider RANTONA , who verbally
acknowledged these results.

*** End of Addendum ***
FINDINGS: MR HEAD FINDINGS

Brain: Focal restricted diffusion involving the dorsal limb of the
right internal capsule. No intracranial hemorrhage. No midline
shift, ventriculomegaly or extra-axial fluid collection. No mass
lesion. Diffuse parenchymal volume loss with ex vacuo dilatation.
Moderate chronic microvascular ischemic changes.

Vascular: Please see MRA.

Skull and upper cervical spine: Normal marrow signal.

Sinuses/Orbits: Normal orbits. Sequela of right lens replacement.
Mild ethmoid and maxillary sinus disease. No mastoid effusion.

Other: None.

MR CIRCLE OF WILLIS FINDINGS

Anterior circulation: Right A1 segment aplasia. Otherwise normal
appearance of the anterior cerebral arteries. Normal appearance of
the petrous and intracavernous segments of the ICAs. Comparatively
diminished signal involving the right

paraclinoid ICA ([DATE]), right M2 and M3 branches. Mild
narrowing/irregularity of the proximal right M1 segment may reflect
atherosclerotic disease ([DATE]). Normal appearance of the left MCA.

Bilateral PCOM hypoplasia. No significant stenosis, aneurysm, or
vascular malformation.

Posterior circulation: Patent, normal caliber appearance of the
posterior cerebral, superior cerebellar and basilar arteries.
Dominant left AICA. Patent V3/V4 segments. No significant stenosis,
proximal occlusion, aneurysm, or vascular malformation.

Venous sinuses: Patent

Anatomic variants: As detailed above.

MRA NECK FINDINGS

There is no high-grade narrowing or focal aneurysm involving the
bilateral carotid arteries. No evidence of dissection. Mild
tortuosity of the cervical segments.

The bilateral vertebral arteries are patent and demonstrate
antegrade flow. Dominant right vertebral artery. No evidence of
high-grade narrowing or focal aneurysm.

Three vessel aortic arch.
IMPRESSION: Acute infarct involving the dorsal limb of the right internal
capsule.

Moderate cerebral atrophy and chronic microvascular ischemic
changes.

Slow flow involving the right paraclinoid ICA, right M2/M3 segments.

Mild right M1 segment narrowing/irregularity likely reflects
atheromatous disease.

No large vessel occlusion, high-grade narrowing or aneurysm.

## 2020-05-13 IMAGING — MR MR HEAD W/O CM
10 series · 48 of 48 positions shown · non-contrast
Comparison: [DATE] head CT.
COMPARISON: [DATE] head CT.

Addendum:
CLINICAL DATA: Weakness

EXAM:
MR HEAD WITHOUT CONTRAST
MR CIRCLE OF WILLIS WITHOUT CONTRAST
MRA OF THE NECK WITHOUT CONTRAST
TECHNIQUE: Multiplanar, multiecho pulse sequences of the brain, circle of
willis and surrounding structures were obtained without intravenous
contrast. Angiographic images of the neck were obtained using MRA
technique without intravenous contrast.

[Series 6: T1 · sagittal · 4.0mm · 0.75mm/px · 3 of 31 slices shown (1 of 2)]
[im 1/31]
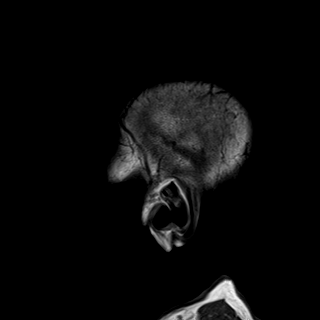
[im 16/31]
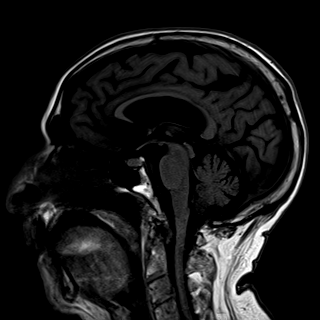
[im 31/31]
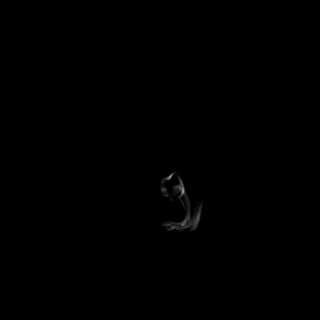

[Series 7: T2 · axial · 4.0mm · 0.36mm/px · z∈[-92,+49]mm · 3 of 29 slices shown (1 of 2)]
[im 1/29]
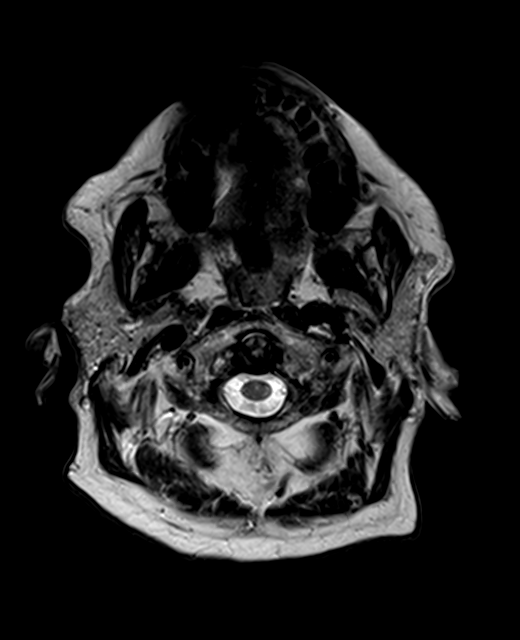
[im 15/29]
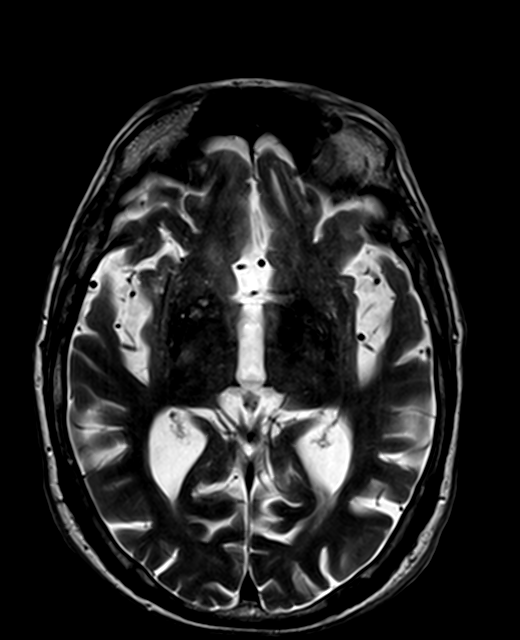
[im 29/29]
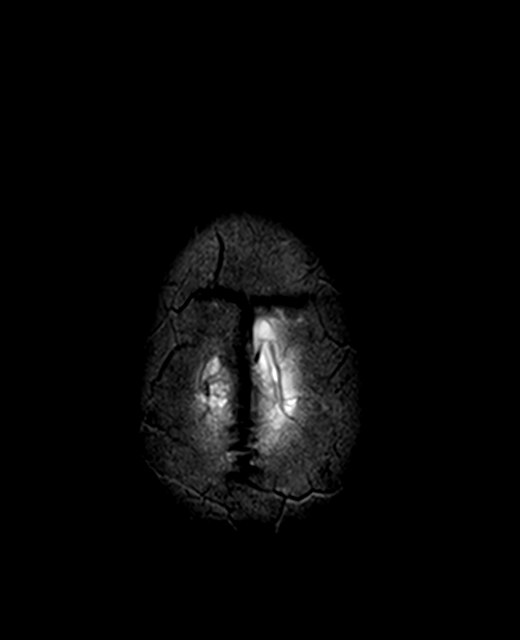

[Series 8: FLAIR · axial · 3.0mm · 0.72mm/px · z∈[-99,+47]mm · 2 of 26 slices shown]
[im 1/26]
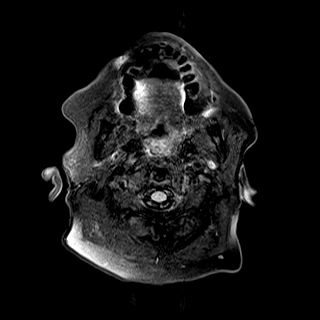
[im 26/26]
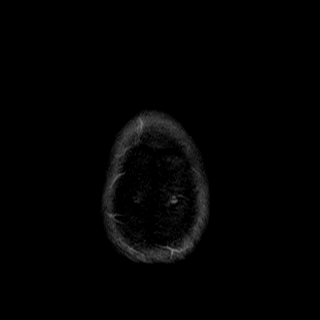

[Series 10: swi_images · axial · 1.5mm · 0.90mm/px · z∈[-90,+49]mm · 7 of 96 slices shown]
[im 1/96]
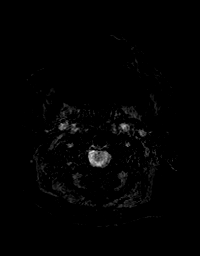
[im 16/96]
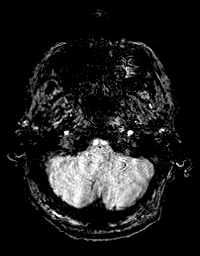
[im 32/96]
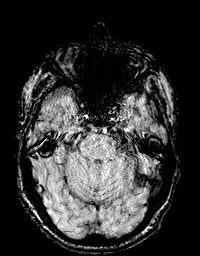
[im 48/96]
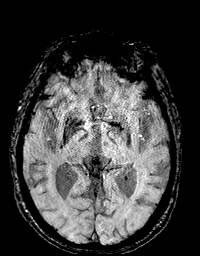
[im 64/96]
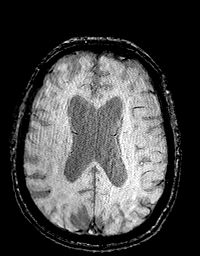
[im 80/96]
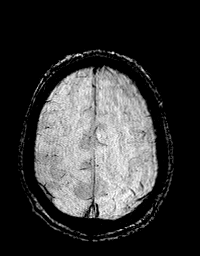
[im 96/96]
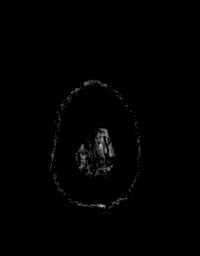

[Series 11: T1 · axial · 1.0mm · 0.94mm/px · z∈[-97,+57]mm · 12 of 160 slices shown (2 of 2)]
[im 1/160]
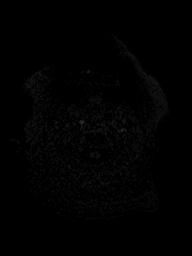
[im 15/160]
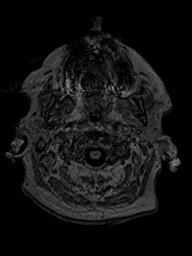
[im 29/160]
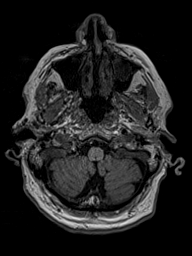
[im 44/160]
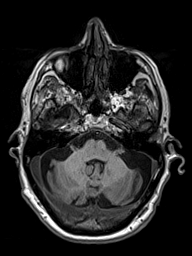
[im 58/160]
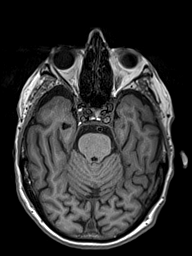
[im 73/160]
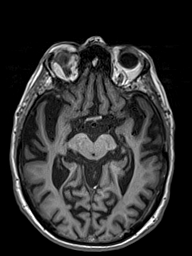
[im 87/160]
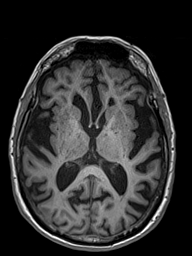
[im 102/160]
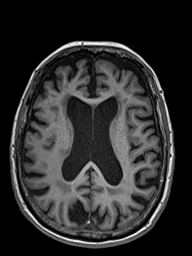
[im 116/160]
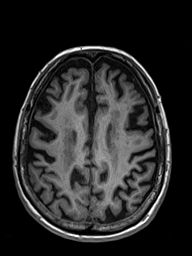
[im 131/160]
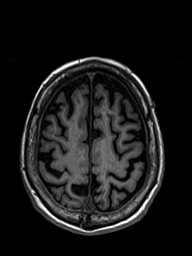
[im 145/160]
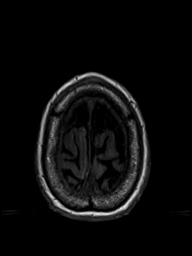
[im 160/160]
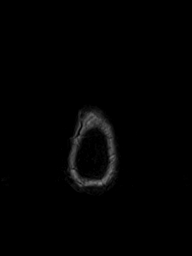

[Series 12: T2 · coronal · 4.5mm · 0.36mm/px · 2 of 30 slices shown (2 of 2)]
[im 1/30]
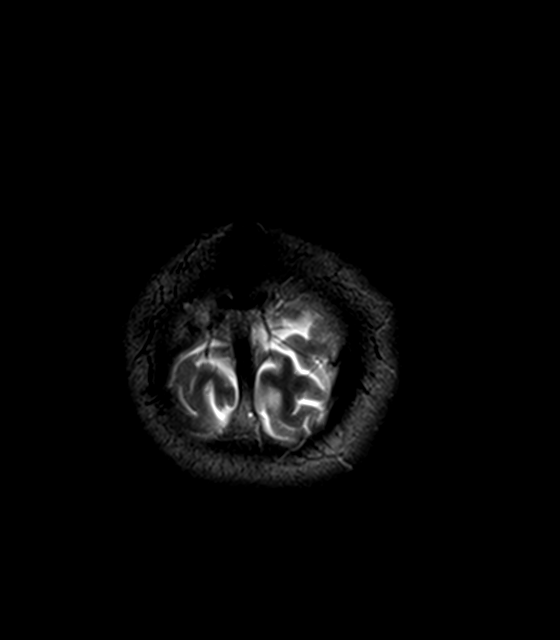
[im 30/30]
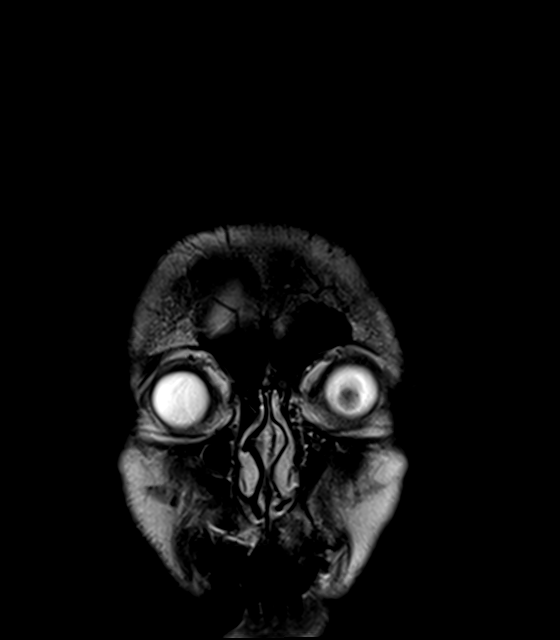

[Series 13: DWI · axial · 3.0mm · 1.50mm/px · z∈[-100,+47]mm · 7 of 94 slices shown (1 of 4)]
[im 1/94]
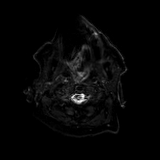
[im 16/94]
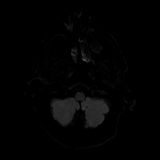
[im 32/94]
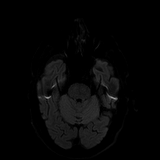
[im 47/94]
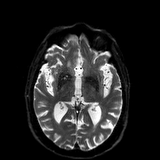
[im 63/94]
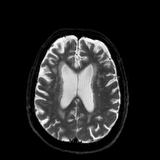
[im 78/94]
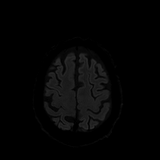
[im 94/94]
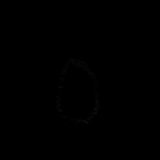

[Series 14: DWI · axial · 3.0mm · 1.50mm/px · z∈[-100,+47]mm · 4 of 47 slices shown (2 of 4)]
[im 1/47]
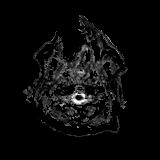
[im 16/47]
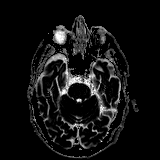
[im 31/47]
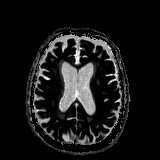
[im 47/47]
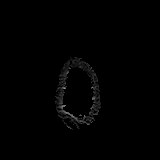

[Series 15: DWI · coronal · 5.0mm · 1.44mm/px · 5 of 68 slices shown (3 of 4)]
[im 1/68]
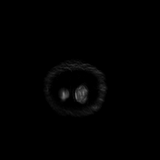
[im 17/68]
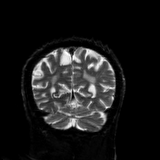
[im 34/68]
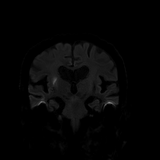
[im 51/68]
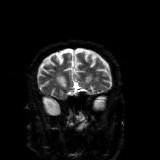
[im 68/68]
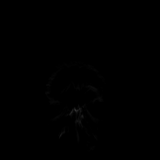

[Series 16: DWI · coronal · 5.0mm · 1.44mm/px · 3 of 34 slices shown (4 of 4)]
[im 1/34]
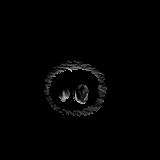
[im 17/34]
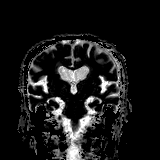
[im 34/34]
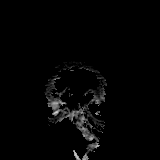

[48 of 48 positions shown; findings below may reference images not displayed]

FINDINGS: MR HEAD FINDINGS

Brain: Focal restricted diffusion involving the dorsal limb of the
right internal capsule. No intracranial hemorrhage. No midline
shift, ventriculomegaly or extra-axial fluid collection. No mass
lesion. Diffuse parenchymal volume loss with ex vacuo dilatation.
Moderate chronic microvascular ischemic changes.

Vascular: Please see MRA.

Skull and upper cervical spine: Normal marrow signal.

Sinuses/Orbits: Normal orbits. Sequela of right lens replacement.
Mild ethmoid and maxillary sinus disease. No mastoid effusion.

Other: None.

MR CIRCLE OF WILLIS FINDINGS

Anterior circulation: Right A1 segment aplasia. Otherwise normal
appearance of the anterior cerebral arteries. Normal appearance of
the petrous and intracavernous segments of the ICAs. Comparatively
diminished signal involving the right

paraclinoid ICA ([DATE]), right M2 and M3 branches. Mild
narrowing/irregularity of the proximal right M1 segment may reflect
atherosclerotic disease ([DATE]). Normal appearance of the left MCA.

Bilateral PCOM hypoplasia. No significant stenosis, aneurysm, or
vascular malformation.

Posterior circulation: Patent, normal caliber appearance of the
posterior cerebral, superior cerebellar and basilar arteries.
Dominant left AICA. Patent V3/V4 segments. No significant stenosis,
proximal occlusion, aneurysm, or vascular malformation.

Venous sinuses: Patent

Anatomic variants: As detailed above.

MRA NECK FINDINGS

There is no high-grade narrowing or focal aneurysm involving the
bilateral carotid arteries. No evidence of dissection. Mild
tortuosity of the cervical segments.

The bilateral vertebral arteries are patent and demonstrate
antegrade flow. Dominant right vertebral artery. No evidence of
high-grade narrowing or focal aneurysm.

Three vessel aortic arch.
IMPRESSION: Acute infarct involving the dorsal limb of the right internal
capsule.

Moderate cerebral atrophy and chronic microvascular ischemic
changes.

Slow flow involving the right paraclinoid ICA, right M2/M3 segments.

Mild right M1 segment narrowing/irregularity likely reflects
atheromatous disease.

No large vessel occlusion, high-grade narrowing or aneurysm.

ADDENDUM:
These results were called by telephone at the time of interpretation
on [DATE] at [DATE] to provider RANTONA , who verbally
acknowledged these results.

*** End of Addendum ***
FINDINGS: MR HEAD FINDINGS

Brain: Focal restricted diffusion involving the dorsal limb of the
right internal capsule. No intracranial hemorrhage. No midline
shift, ventriculomegaly or extra-axial fluid collection. No mass
lesion. Diffuse parenchymal volume loss with ex vacuo dilatation.
Moderate chronic microvascular ischemic changes.

Vascular: Please see MRA.

Skull and upper cervical spine: Normal marrow signal.

Sinuses/Orbits: Normal orbits. Sequela of right lens replacement.
Mild ethmoid and maxillary sinus disease. No mastoid effusion.

Other: None.

MR CIRCLE OF WILLIS FINDINGS

Anterior circulation: Right A1 segment aplasia. Otherwise normal
appearance of the anterior cerebral arteries. Normal appearance of
the petrous and intracavernous segments of the ICAs. Comparatively
diminished signal involving the right

paraclinoid ICA ([DATE]), right M2 and M3 branches. Mild
narrowing/irregularity of the proximal right M1 segment may reflect
atherosclerotic disease ([DATE]). Normal appearance of the left MCA.

Bilateral PCOM hypoplasia. No significant stenosis, aneurysm, or
vascular malformation.

Posterior circulation: Patent, normal caliber appearance of the
posterior cerebral, superior cerebellar and basilar arteries.
Dominant left AICA. Patent V3/V4 segments. No significant stenosis,
proximal occlusion, aneurysm, or vascular malformation.

Venous sinuses: Patent

Anatomic variants: As detailed above.

MRA NECK FINDINGS

There is no high-grade narrowing or focal aneurysm involving the
bilateral carotid arteries. No evidence of dissection. Mild
tortuosity of the cervical segments.

The bilateral vertebral arteries are patent and demonstrate
antegrade flow. Dominant right vertebral artery. No evidence of
high-grade narrowing or focal aneurysm.

Three vessel aortic arch.
IMPRESSION: Acute infarct involving the dorsal limb of the right internal
capsule.

Moderate cerebral atrophy and chronic microvascular ischemic
changes.

Slow flow involving the right paraclinoid ICA, right M2/M3 segments.

Mild right M1 segment narrowing/irregularity likely reflects
atheromatous disease.

No large vessel occlusion, high-grade narrowing or aneurysm.

## 2020-09-21 DIAGNOSIS — M25552 Pain in left hip: Secondary | ICD-10-CM | POA: Diagnosis not present

## 2020-09-21 DIAGNOSIS — M1612 Unilateral primary osteoarthritis, left hip: Secondary | ICD-10-CM | POA: Diagnosis not present

## 2020-09-21 DIAGNOSIS — N139 Obstructive and reflux uropathy, unspecified: Secondary | ICD-10-CM | POA: Diagnosis not present

## 2020-09-21 DIAGNOSIS — Z4901 Encounter for fitting and adjustment of extracorporeal dialysis catheter: Secondary | ICD-10-CM | POA: Diagnosis not present

## 2020-09-21 DIAGNOSIS — R339 Retention of urine, unspecified: Secondary | ICD-10-CM | POA: Diagnosis not present

## 2020-09-21 DIAGNOSIS — R278 Other lack of coordination: Secondary | ICD-10-CM | POA: Diagnosis not present

## 2020-09-21 DIAGNOSIS — W19XXXA Unspecified fall, initial encounter: Secondary | ICD-10-CM | POA: Diagnosis not present

## 2020-09-21 DIAGNOSIS — R279 Unspecified lack of coordination: Secondary | ICD-10-CM | POA: Diagnosis not present

## 2020-09-21 DIAGNOSIS — D649 Anemia, unspecified: Secondary | ICD-10-CM | POA: Diagnosis not present

## 2020-09-21 DIAGNOSIS — R488 Other symbolic dysfunctions: Secondary | ICD-10-CM | POA: Diagnosis not present

## 2020-09-21 DIAGNOSIS — S72002A Fracture of unspecified part of neck of left femur, initial encounter for closed fracture: Secondary | ICD-10-CM | POA: Diagnosis not present

## 2020-09-21 DIAGNOSIS — Z7982 Long term (current) use of aspirin: Secondary | ICD-10-CM | POA: Diagnosis not present

## 2020-09-21 DIAGNOSIS — Z87891 Personal history of nicotine dependence: Secondary | ICD-10-CM | POA: Diagnosis not present

## 2020-09-21 DIAGNOSIS — E871 Hypo-osmolality and hyponatremia: Secondary | ICD-10-CM | POA: Diagnosis not present

## 2020-09-21 DIAGNOSIS — R338 Other retention of urine: Secondary | ICD-10-CM | POA: Diagnosis not present

## 2020-09-21 DIAGNOSIS — S72002D Fracture of unspecified part of neck of left femur, subsequent encounter for closed fracture with routine healing: Secondary | ICD-10-CM | POA: Diagnosis not present

## 2020-09-21 DIAGNOSIS — G3184 Mild cognitive impairment, so stated: Secondary | ICD-10-CM | POA: Diagnosis not present

## 2020-09-21 DIAGNOSIS — Z471 Aftercare following joint replacement surgery: Secondary | ICD-10-CM | POA: Diagnosis not present

## 2020-09-21 DIAGNOSIS — Z8673 Personal history of transient ischemic attack (TIA), and cerebral infarction without residual deficits: Secondary | ICD-10-CM | POA: Diagnosis not present

## 2020-09-21 DIAGNOSIS — R29818 Other symptoms and signs involving the nervous system: Secondary | ICD-10-CM | POA: Diagnosis not present

## 2020-09-21 DIAGNOSIS — I69054 Hemiplegia and hemiparesis following nontraumatic subarachnoid hemorrhage affecting left non-dominant side: Secondary | ICD-10-CM | POA: Diagnosis not present

## 2020-09-21 DIAGNOSIS — R5381 Other malaise: Secondary | ICD-10-CM | POA: Diagnosis not present

## 2020-09-21 DIAGNOSIS — S72012A Unspecified intracapsular fracture of left femur, initial encounter for closed fracture: Secondary | ICD-10-CM | POA: Diagnosis not present

## 2020-09-21 DIAGNOSIS — N1330 Unspecified hydronephrosis: Secondary | ICD-10-CM | POA: Diagnosis not present

## 2020-09-21 DIAGNOSIS — S72012D Unspecified intracapsular fracture of left femur, subsequent encounter for closed fracture with routine healing: Secondary | ICD-10-CM | POA: Diagnosis not present

## 2020-09-21 DIAGNOSIS — R262 Difficulty in walking, not elsewhere classified: Secondary | ICD-10-CM | POA: Diagnosis not present

## 2020-09-21 DIAGNOSIS — N179 Acute kidney failure, unspecified: Secondary | ICD-10-CM | POA: Diagnosis not present

## 2020-09-21 DIAGNOSIS — M6281 Muscle weakness (generalized): Secondary | ICD-10-CM | POA: Diagnosis not present

## 2020-09-21 DIAGNOSIS — I9581 Postprocedural hypotension: Secondary | ICD-10-CM | POA: Diagnosis not present

## 2020-09-21 DIAGNOSIS — R531 Weakness: Secondary | ICD-10-CM | POA: Diagnosis not present

## 2020-09-21 DIAGNOSIS — Z9181 History of falling: Secondary | ICD-10-CM | POA: Diagnosis not present

## 2020-09-21 DIAGNOSIS — E875 Hyperkalemia: Secondary | ICD-10-CM | POA: Diagnosis not present

## 2020-09-21 DIAGNOSIS — G319 Degenerative disease of nervous system, unspecified: Secondary | ICD-10-CM | POA: Diagnosis not present

## 2020-09-21 DIAGNOSIS — R42 Dizziness and giddiness: Secondary | ICD-10-CM | POA: Diagnosis not present

## 2020-09-21 DIAGNOSIS — I77810 Thoracic aortic ectasia: Secondary | ICD-10-CM | POA: Diagnosis not present

## 2020-09-21 DIAGNOSIS — N170 Acute kidney failure with tubular necrosis: Secondary | ICD-10-CM | POA: Diagnosis not present

## 2020-09-21 DIAGNOSIS — Z743 Need for continuous supervision: Secondary | ICD-10-CM | POA: Diagnosis not present

## 2020-09-21 DIAGNOSIS — I083 Combined rheumatic disorders of mitral, aortic and tricuspid valves: Secondary | ICD-10-CM | POA: Diagnosis not present

## 2020-09-21 DIAGNOSIS — Z20822 Contact with and (suspected) exposure to covid-19: Secondary | ICD-10-CM | POA: Diagnosis not present

## 2020-09-21 DIAGNOSIS — Z96642 Presence of left artificial hip joint: Secondary | ICD-10-CM | POA: Diagnosis not present

## 2020-09-21 DIAGNOSIS — D62 Acute posthemorrhagic anemia: Secondary | ICD-10-CM | POA: Diagnosis not present

## 2020-09-21 DIAGNOSIS — R251 Tremor, unspecified: Secondary | ICD-10-CM | POA: Diagnosis not present

## 2020-09-21 DIAGNOSIS — I6930 Unspecified sequelae of cerebral infarction: Secondary | ICD-10-CM | POA: Diagnosis not present

## 2020-10-03 DIAGNOSIS — N139 Obstructive and reflux uropathy, unspecified: Secondary | ICD-10-CM | POA: Diagnosis not present

## 2020-10-03 DIAGNOSIS — I6930 Unspecified sequelae of cerebral infarction: Secondary | ICD-10-CM | POA: Diagnosis not present

## 2020-10-03 DIAGNOSIS — R251 Tremor, unspecified: Secondary | ICD-10-CM | POA: Diagnosis not present

## 2020-10-03 DIAGNOSIS — Z743 Need for continuous supervision: Secondary | ICD-10-CM | POA: Diagnosis not present

## 2020-10-03 DIAGNOSIS — R488 Other symbolic dysfunctions: Secondary | ICD-10-CM | POA: Diagnosis not present

## 2020-10-03 DIAGNOSIS — E611 Iron deficiency: Secondary | ICD-10-CM | POA: Diagnosis not present

## 2020-10-03 DIAGNOSIS — R262 Difficulty in walking, not elsewhere classified: Secondary | ICD-10-CM | POA: Diagnosis not present

## 2020-10-03 DIAGNOSIS — Z9181 History of falling: Secondary | ICD-10-CM | POA: Diagnosis not present

## 2020-10-03 DIAGNOSIS — E871 Hypo-osmolality and hyponatremia: Secondary | ICD-10-CM | POA: Diagnosis not present

## 2020-10-03 DIAGNOSIS — N472 Paraphimosis: Secondary | ICD-10-CM | POA: Diagnosis not present

## 2020-10-03 DIAGNOSIS — R338 Other retention of urine: Secondary | ICD-10-CM | POA: Diagnosis not present

## 2020-10-03 DIAGNOSIS — M6281 Muscle weakness (generalized): Secondary | ICD-10-CM | POA: Diagnosis not present

## 2020-10-03 DIAGNOSIS — G3184 Mild cognitive impairment, so stated: Secondary | ICD-10-CM | POA: Diagnosis not present

## 2020-10-03 DIAGNOSIS — N179 Acute kidney failure, unspecified: Secondary | ICD-10-CM | POA: Diagnosis not present

## 2020-10-03 DIAGNOSIS — I69054 Hemiplegia and hemiparesis following nontraumatic subarachnoid hemorrhage affecting left non-dominant side: Secondary | ICD-10-CM | POA: Diagnosis not present

## 2020-10-03 DIAGNOSIS — R319 Hematuria, unspecified: Secondary | ICD-10-CM | POA: Diagnosis not present

## 2020-10-03 DIAGNOSIS — R531 Weakness: Secondary | ICD-10-CM | POA: Diagnosis not present

## 2020-10-03 DIAGNOSIS — R279 Unspecified lack of coordination: Secondary | ICD-10-CM | POA: Diagnosis not present

## 2020-10-03 DIAGNOSIS — R278 Other lack of coordination: Secondary | ICD-10-CM | POA: Diagnosis not present

## 2020-10-03 DIAGNOSIS — R5381 Other malaise: Secondary | ICD-10-CM | POA: Diagnosis not present

## 2020-10-03 DIAGNOSIS — D649 Anemia, unspecified: Secondary | ICD-10-CM | POA: Diagnosis not present

## 2020-10-03 DIAGNOSIS — D509 Iron deficiency anemia, unspecified: Secondary | ICD-10-CM | POA: Diagnosis not present

## 2020-10-03 DIAGNOSIS — R310 Gross hematuria: Secondary | ICD-10-CM | POA: Diagnosis not present

## 2020-10-03 DIAGNOSIS — S72012D Unspecified intracapsular fracture of left femur, subsequent encounter for closed fracture with routine healing: Secondary | ICD-10-CM | POA: Diagnosis not present

## 2020-10-04 DIAGNOSIS — D649 Anemia, unspecified: Secondary | ICD-10-CM | POA: Diagnosis not present

## 2020-10-04 DIAGNOSIS — I69054 Hemiplegia and hemiparesis following nontraumatic subarachnoid hemorrhage affecting left non-dominant side: Secondary | ICD-10-CM | POA: Diagnosis not present

## 2020-10-04 DIAGNOSIS — N179 Acute kidney failure, unspecified: Secondary | ICD-10-CM | POA: Diagnosis not present

## 2020-10-04 DIAGNOSIS — N139 Obstructive and reflux uropathy, unspecified: Secondary | ICD-10-CM | POA: Diagnosis not present

## 2020-10-04 DIAGNOSIS — M6281 Muscle weakness (generalized): Secondary | ICD-10-CM | POA: Diagnosis not present

## 2020-10-04 DIAGNOSIS — Z9181 History of falling: Secondary | ICD-10-CM | POA: Diagnosis not present

## 2020-10-04 DIAGNOSIS — S72012D Unspecified intracapsular fracture of left femur, subsequent encounter for closed fracture with routine healing: Secondary | ICD-10-CM | POA: Diagnosis not present

## 2020-10-04 DIAGNOSIS — E871 Hypo-osmolality and hyponatremia: Secondary | ICD-10-CM | POA: Diagnosis not present

## 2020-10-13 DIAGNOSIS — R310 Gross hematuria: Secondary | ICD-10-CM | POA: Diagnosis not present

## 2020-10-18 DIAGNOSIS — R310 Gross hematuria: Secondary | ICD-10-CM | POA: Diagnosis not present

## 2020-10-18 DIAGNOSIS — E611 Iron deficiency: Secondary | ICD-10-CM | POA: Diagnosis not present

## 2020-10-20 DIAGNOSIS — R319 Hematuria, unspecified: Secondary | ICD-10-CM | POA: Diagnosis not present

## 2020-10-20 DIAGNOSIS — D649 Anemia, unspecified: Secondary | ICD-10-CM | POA: Diagnosis not present

## 2020-10-22 DIAGNOSIS — N472 Paraphimosis: Secondary | ICD-10-CM | POA: Diagnosis not present

## 2020-10-22 DIAGNOSIS — R338 Other retention of urine: Secondary | ICD-10-CM | POA: Diagnosis not present

## 2020-10-25 DIAGNOSIS — I6782 Cerebral ischemia: Secondary | ICD-10-CM | POA: Diagnosis not present

## 2020-10-25 DIAGNOSIS — R339 Retention of urine, unspecified: Secondary | ICD-10-CM | POA: Diagnosis not present

## 2020-10-25 DIAGNOSIS — I69359 Hemiplegia and hemiparesis following cerebral infarction affecting unspecified side: Secondary | ICD-10-CM | POA: Diagnosis not present

## 2020-10-26 DIAGNOSIS — D649 Anemia, unspecified: Secondary | ICD-10-CM | POA: Diagnosis not present

## 2020-10-26 DIAGNOSIS — E559 Vitamin D deficiency, unspecified: Secondary | ICD-10-CM | POA: Diagnosis not present

## 2020-10-26 DIAGNOSIS — Z13228 Encounter for screening for other metabolic disorders: Secondary | ICD-10-CM | POA: Diagnosis not present

## 2020-10-27 DIAGNOSIS — N3080 Other cystitis without hematuria: Secondary | ICD-10-CM | POA: Diagnosis not present

## 2020-10-27 DIAGNOSIS — D638 Anemia in other chronic diseases classified elsewhere: Secondary | ICD-10-CM | POA: Diagnosis not present

## 2020-10-27 DIAGNOSIS — E559 Vitamin D deficiency, unspecified: Secondary | ICD-10-CM | POA: Diagnosis not present

## 2020-10-28 DIAGNOSIS — Z471 Aftercare following joint replacement surgery: Secondary | ICD-10-CM | POA: Diagnosis not present

## 2020-10-28 DIAGNOSIS — R2689 Other abnormalities of gait and mobility: Secondary | ICD-10-CM | POA: Diagnosis not present

## 2020-10-28 DIAGNOSIS — R488 Other symbolic dysfunctions: Secondary | ICD-10-CM | POA: Diagnosis not present

## 2020-10-29 DIAGNOSIS — R2689 Other abnormalities of gait and mobility: Secondary | ICD-10-CM | POA: Diagnosis not present

## 2020-10-29 DIAGNOSIS — Z471 Aftercare following joint replacement surgery: Secondary | ICD-10-CM | POA: Diagnosis not present

## 2020-10-29 DIAGNOSIS — R488 Other symbolic dysfunctions: Secondary | ICD-10-CM | POA: Diagnosis not present

## 2020-11-01 DIAGNOSIS — Z471 Aftercare following joint replacement surgery: Secondary | ICD-10-CM | POA: Diagnosis not present

## 2020-11-01 DIAGNOSIS — R488 Other symbolic dysfunctions: Secondary | ICD-10-CM | POA: Diagnosis not present

## 2020-11-01 DIAGNOSIS — R2689 Other abnormalities of gait and mobility: Secondary | ICD-10-CM | POA: Diagnosis not present

## 2020-11-02 DIAGNOSIS — Z471 Aftercare following joint replacement surgery: Secondary | ICD-10-CM | POA: Diagnosis not present

## 2020-11-02 DIAGNOSIS — R488 Other symbolic dysfunctions: Secondary | ICD-10-CM | POA: Diagnosis not present

## 2020-11-02 DIAGNOSIS — R2689 Other abnormalities of gait and mobility: Secondary | ICD-10-CM | POA: Diagnosis not present

## 2020-11-03 DIAGNOSIS — Z471 Aftercare following joint replacement surgery: Secondary | ICD-10-CM | POA: Diagnosis not present

## 2020-11-03 DIAGNOSIS — R488 Other symbolic dysfunctions: Secondary | ICD-10-CM | POA: Diagnosis not present

## 2020-11-03 DIAGNOSIS — R2689 Other abnormalities of gait and mobility: Secondary | ICD-10-CM | POA: Diagnosis not present

## 2020-11-04 DIAGNOSIS — R488 Other symbolic dysfunctions: Secondary | ICD-10-CM | POA: Diagnosis not present

## 2020-11-04 DIAGNOSIS — Z471 Aftercare following joint replacement surgery: Secondary | ICD-10-CM | POA: Diagnosis not present

## 2020-11-04 DIAGNOSIS — R338 Other retention of urine: Secondary | ICD-10-CM | POA: Diagnosis not present

## 2020-11-04 DIAGNOSIS — N3080 Other cystitis without hematuria: Secondary | ICD-10-CM | POA: Diagnosis not present

## 2020-11-04 DIAGNOSIS — R2689 Other abnormalities of gait and mobility: Secondary | ICD-10-CM | POA: Diagnosis not present

## 2020-11-05 DIAGNOSIS — R799 Abnormal finding of blood chemistry, unspecified: Secondary | ICD-10-CM | POA: Diagnosis not present

## 2020-11-05 DIAGNOSIS — N390 Urinary tract infection, site not specified: Secondary | ICD-10-CM | POA: Diagnosis not present

## 2020-11-05 DIAGNOSIS — R2689 Other abnormalities of gait and mobility: Secondary | ICD-10-CM | POA: Diagnosis not present

## 2020-11-05 DIAGNOSIS — Z471 Aftercare following joint replacement surgery: Secondary | ICD-10-CM | POA: Diagnosis not present

## 2020-11-05 DIAGNOSIS — R488 Other symbolic dysfunctions: Secondary | ICD-10-CM | POA: Diagnosis not present

## 2020-11-08 DIAGNOSIS — R488 Other symbolic dysfunctions: Secondary | ICD-10-CM | POA: Diagnosis not present

## 2020-11-08 DIAGNOSIS — Z471 Aftercare following joint replacement surgery: Secondary | ICD-10-CM | POA: Diagnosis not present

## 2020-11-08 DIAGNOSIS — R2689 Other abnormalities of gait and mobility: Secondary | ICD-10-CM | POA: Diagnosis not present

## 2020-11-08 DIAGNOSIS — G4720 Circadian rhythm sleep disorder, unspecified type: Secondary | ICD-10-CM | POA: Diagnosis not present

## 2020-11-09 DIAGNOSIS — R488 Other symbolic dysfunctions: Secondary | ICD-10-CM | POA: Diagnosis not present

## 2020-11-09 DIAGNOSIS — R2689 Other abnormalities of gait and mobility: Secondary | ICD-10-CM | POA: Diagnosis not present

## 2020-11-09 DIAGNOSIS — Z471 Aftercare following joint replacement surgery: Secondary | ICD-10-CM | POA: Diagnosis not present

## 2020-11-10 DIAGNOSIS — R338 Other retention of urine: Secondary | ICD-10-CM | POA: Diagnosis not present

## 2020-11-10 DIAGNOSIS — R488 Other symbolic dysfunctions: Secondary | ICD-10-CM | POA: Diagnosis not present

## 2020-11-10 DIAGNOSIS — R2689 Other abnormalities of gait and mobility: Secondary | ICD-10-CM | POA: Diagnosis not present

## 2020-11-10 DIAGNOSIS — D638 Anemia in other chronic diseases classified elsewhere: Secondary | ICD-10-CM | POA: Diagnosis not present

## 2020-11-10 DIAGNOSIS — Z471 Aftercare following joint replacement surgery: Secondary | ICD-10-CM | POA: Diagnosis not present

## 2020-11-10 DIAGNOSIS — G478 Other sleep disorders: Secondary | ICD-10-CM | POA: Diagnosis not present

## 2020-11-11 DIAGNOSIS — I6782 Cerebral ischemia: Secondary | ICD-10-CM | POA: Diagnosis not present

## 2020-11-11 DIAGNOSIS — R2689 Other abnormalities of gait and mobility: Secondary | ICD-10-CM | POA: Diagnosis not present

## 2020-11-11 DIAGNOSIS — R338 Other retention of urine: Secondary | ICD-10-CM | POA: Diagnosis not present

## 2020-11-11 DIAGNOSIS — R488 Other symbolic dysfunctions: Secondary | ICD-10-CM | POA: Diagnosis not present

## 2020-11-11 DIAGNOSIS — S61412A Laceration without foreign body of left hand, initial encounter: Secondary | ICD-10-CM | POA: Diagnosis not present

## 2020-11-11 DIAGNOSIS — S0990XA Unspecified injury of head, initial encounter: Secondary | ICD-10-CM | POA: Diagnosis not present

## 2020-11-11 DIAGNOSIS — Z79899 Other long term (current) drug therapy: Secondary | ICD-10-CM | POA: Diagnosis not present

## 2020-11-11 DIAGNOSIS — Z471 Aftercare following joint replacement surgery: Secondary | ICD-10-CM | POA: Diagnosis not present

## 2020-11-12 DIAGNOSIS — R2689 Other abnormalities of gait and mobility: Secondary | ICD-10-CM | POA: Diagnosis not present

## 2020-11-12 DIAGNOSIS — R488 Other symbolic dysfunctions: Secondary | ICD-10-CM | POA: Diagnosis not present

## 2020-11-12 DIAGNOSIS — Z471 Aftercare following joint replacement surgery: Secondary | ICD-10-CM | POA: Diagnosis not present

## 2020-11-15 DIAGNOSIS — Z471 Aftercare following joint replacement surgery: Secondary | ICD-10-CM | POA: Diagnosis not present

## 2020-11-15 DIAGNOSIS — R488 Other symbolic dysfunctions: Secondary | ICD-10-CM | POA: Diagnosis not present

## 2020-11-15 DIAGNOSIS — R2689 Other abnormalities of gait and mobility: Secondary | ICD-10-CM | POA: Diagnosis not present

## 2020-11-16 DIAGNOSIS — R488 Other symbolic dysfunctions: Secondary | ICD-10-CM | POA: Diagnosis not present

## 2020-11-16 DIAGNOSIS — R2689 Other abnormalities of gait and mobility: Secondary | ICD-10-CM | POA: Diagnosis not present

## 2020-11-16 DIAGNOSIS — Z471 Aftercare following joint replacement surgery: Secondary | ICD-10-CM | POA: Diagnosis not present

## 2020-11-17 DIAGNOSIS — Z471 Aftercare following joint replacement surgery: Secondary | ICD-10-CM | POA: Diagnosis not present

## 2020-11-17 DIAGNOSIS — R2689 Other abnormalities of gait and mobility: Secondary | ICD-10-CM | POA: Diagnosis not present

## 2020-11-17 DIAGNOSIS — R488 Other symbolic dysfunctions: Secondary | ICD-10-CM | POA: Diagnosis not present

## 2020-11-18 DIAGNOSIS — I6782 Cerebral ischemia: Secondary | ICD-10-CM | POA: Diagnosis not present

## 2020-11-18 DIAGNOSIS — I69359 Hemiplegia and hemiparesis following cerebral infarction affecting unspecified side: Secondary | ICD-10-CM | POA: Diagnosis not present

## 2020-11-18 DIAGNOSIS — Z471 Aftercare following joint replacement surgery: Secondary | ICD-10-CM | POA: Diagnosis not present

## 2020-11-18 DIAGNOSIS — E559 Vitamin D deficiency, unspecified: Secondary | ICD-10-CM | POA: Diagnosis not present

## 2020-11-18 DIAGNOSIS — R2689 Other abnormalities of gait and mobility: Secondary | ICD-10-CM | POA: Diagnosis not present

## 2020-11-18 DIAGNOSIS — R7989 Other specified abnormal findings of blood chemistry: Secondary | ICD-10-CM | POA: Diagnosis not present

## 2020-11-18 DIAGNOSIS — R488 Other symbolic dysfunctions: Secondary | ICD-10-CM | POA: Diagnosis not present

## 2020-11-19 DIAGNOSIS — Z471 Aftercare following joint replacement surgery: Secondary | ICD-10-CM | POA: Diagnosis not present

## 2020-11-19 DIAGNOSIS — R2689 Other abnormalities of gait and mobility: Secondary | ICD-10-CM | POA: Diagnosis not present

## 2020-11-19 DIAGNOSIS — R488 Other symbolic dysfunctions: Secondary | ICD-10-CM | POA: Diagnosis not present

## 2020-11-22 DIAGNOSIS — M25552 Pain in left hip: Secondary | ICD-10-CM | POA: Diagnosis not present

## 2020-11-22 DIAGNOSIS — R488 Other symbolic dysfunctions: Secondary | ICD-10-CM | POA: Diagnosis not present

## 2020-11-22 DIAGNOSIS — R2689 Other abnormalities of gait and mobility: Secondary | ICD-10-CM | POA: Diagnosis not present

## 2020-11-22 DIAGNOSIS — Z471 Aftercare following joint replacement surgery: Secondary | ICD-10-CM | POA: Diagnosis not present

## 2020-11-23 DIAGNOSIS — R2689 Other abnormalities of gait and mobility: Secondary | ICD-10-CM | POA: Diagnosis not present

## 2020-11-23 DIAGNOSIS — Z471 Aftercare following joint replacement surgery: Secondary | ICD-10-CM | POA: Diagnosis not present

## 2020-11-23 DIAGNOSIS — R488 Other symbolic dysfunctions: Secondary | ICD-10-CM | POA: Diagnosis not present

## 2020-11-24 DIAGNOSIS — R488 Other symbolic dysfunctions: Secondary | ICD-10-CM | POA: Diagnosis not present

## 2020-11-24 DIAGNOSIS — Z471 Aftercare following joint replacement surgery: Secondary | ICD-10-CM | POA: Diagnosis not present

## 2020-11-24 DIAGNOSIS — R2689 Other abnormalities of gait and mobility: Secondary | ICD-10-CM | POA: Diagnosis not present

## 2020-11-25 DIAGNOSIS — R2689 Other abnormalities of gait and mobility: Secondary | ICD-10-CM | POA: Diagnosis not present

## 2020-11-25 DIAGNOSIS — Z471 Aftercare following joint replacement surgery: Secondary | ICD-10-CM | POA: Diagnosis not present

## 2020-11-25 DIAGNOSIS — R488 Other symbolic dysfunctions: Secondary | ICD-10-CM | POA: Diagnosis not present

## 2020-11-26 DIAGNOSIS — R2689 Other abnormalities of gait and mobility: Secondary | ICD-10-CM | POA: Diagnosis not present

## 2020-11-26 DIAGNOSIS — Z471 Aftercare following joint replacement surgery: Secondary | ICD-10-CM | POA: Diagnosis not present

## 2020-11-26 DIAGNOSIS — R488 Other symbolic dysfunctions: Secondary | ICD-10-CM | POA: Diagnosis not present

## 2020-11-29 DIAGNOSIS — R488 Other symbolic dysfunctions: Secondary | ICD-10-CM | POA: Diagnosis not present

## 2020-11-29 DIAGNOSIS — Z471 Aftercare following joint replacement surgery: Secondary | ICD-10-CM | POA: Diagnosis not present

## 2020-11-29 DIAGNOSIS — R338 Other retention of urine: Secondary | ICD-10-CM | POA: Diagnosis not present

## 2020-11-29 DIAGNOSIS — R2689 Other abnormalities of gait and mobility: Secondary | ICD-10-CM | POA: Diagnosis not present

## 2020-11-30 DIAGNOSIS — R2689 Other abnormalities of gait and mobility: Secondary | ICD-10-CM | POA: Diagnosis not present

## 2020-11-30 DIAGNOSIS — R488 Other symbolic dysfunctions: Secondary | ICD-10-CM | POA: Diagnosis not present

## 2020-11-30 DIAGNOSIS — Z471 Aftercare following joint replacement surgery: Secondary | ICD-10-CM | POA: Diagnosis not present

## 2020-12-01 DIAGNOSIS — R488 Other symbolic dysfunctions: Secondary | ICD-10-CM | POA: Diagnosis not present

## 2020-12-01 DIAGNOSIS — Z471 Aftercare following joint replacement surgery: Secondary | ICD-10-CM | POA: Diagnosis not present

## 2020-12-01 DIAGNOSIS — R2689 Other abnormalities of gait and mobility: Secondary | ICD-10-CM | POA: Diagnosis not present

## 2020-12-02 DIAGNOSIS — Z471 Aftercare following joint replacement surgery: Secondary | ICD-10-CM | POA: Diagnosis not present

## 2020-12-02 DIAGNOSIS — R488 Other symbolic dysfunctions: Secondary | ICD-10-CM | POA: Diagnosis not present

## 2020-12-02 DIAGNOSIS — R2689 Other abnormalities of gait and mobility: Secondary | ICD-10-CM | POA: Diagnosis not present

## 2020-12-16 DIAGNOSIS — D509 Iron deficiency anemia, unspecified: Secondary | ICD-10-CM | POA: Diagnosis not present

## 2020-12-16 DIAGNOSIS — Z20822 Contact with and (suspected) exposure to covid-19: Secondary | ICD-10-CM | POA: Diagnosis not present

## 2020-12-16 DIAGNOSIS — N139 Obstructive and reflux uropathy, unspecified: Secondary | ICD-10-CM | POA: Diagnosis not present

## 2020-12-16 DIAGNOSIS — T83511A Infection and inflammatory reaction due to indwelling urethral catheter, initial encounter: Secondary | ICD-10-CM | POA: Diagnosis not present

## 2020-12-16 DIAGNOSIS — I351 Nonrheumatic aortic (valve) insufficiency: Secondary | ICD-10-CM | POA: Diagnosis not present

## 2020-12-16 DIAGNOSIS — I6381 Other cerebral infarction due to occlusion or stenosis of small artery: Secondary | ICD-10-CM | POA: Diagnosis not present

## 2020-12-16 DIAGNOSIS — Z87891 Personal history of nicotine dependence: Secondary | ICD-10-CM | POA: Diagnosis not present

## 2020-12-16 DIAGNOSIS — R531 Weakness: Secondary | ICD-10-CM | POA: Diagnosis not present

## 2020-12-16 DIAGNOSIS — M5432 Sciatica, left side: Secondary | ICD-10-CM | POA: Diagnosis not present

## 2020-12-16 DIAGNOSIS — M48061 Spinal stenosis, lumbar region without neurogenic claudication: Secondary | ICD-10-CM | POA: Diagnosis not present

## 2020-12-16 DIAGNOSIS — E039 Hypothyroidism, unspecified: Secondary | ICD-10-CM | POA: Diagnosis not present

## 2020-12-16 DIAGNOSIS — E782 Mixed hyperlipidemia: Secondary | ICD-10-CM | POA: Diagnosis not present

## 2020-12-16 DIAGNOSIS — I6523 Occlusion and stenosis of bilateral carotid arteries: Secondary | ICD-10-CM | POA: Diagnosis not present

## 2020-12-16 DIAGNOSIS — G3184 Mild cognitive impairment, so stated: Secondary | ICD-10-CM | POA: Diagnosis not present

## 2020-12-16 DIAGNOSIS — Z743 Need for continuous supervision: Secondary | ICD-10-CM | POA: Diagnosis not present

## 2020-12-16 DIAGNOSIS — M5136 Other intervertebral disc degeneration, lumbar region: Secondary | ICD-10-CM | POA: Diagnosis not present

## 2020-12-16 DIAGNOSIS — I679 Cerebrovascular disease, unspecified: Secondary | ICD-10-CM | POA: Diagnosis not present

## 2020-12-16 DIAGNOSIS — I69354 Hemiplegia and hemiparesis following cerebral infarction affecting left non-dominant side: Secondary | ICD-10-CM | POA: Diagnosis not present

## 2020-12-16 DIAGNOSIS — M4807 Spinal stenosis, lumbosacral region: Secondary | ICD-10-CM | POA: Diagnosis not present

## 2020-12-16 DIAGNOSIS — I6782 Cerebral ischemia: Secondary | ICD-10-CM | POA: Diagnosis not present

## 2020-12-16 DIAGNOSIS — G8194 Hemiplegia, unspecified affecting left nondominant side: Secondary | ICD-10-CM | POA: Diagnosis not present

## 2020-12-16 DIAGNOSIS — R278 Other lack of coordination: Secondary | ICD-10-CM | POA: Diagnosis not present

## 2020-12-16 DIAGNOSIS — Z79899 Other long term (current) drug therapy: Secondary | ICD-10-CM | POA: Diagnosis not present

## 2020-12-16 DIAGNOSIS — Z96642 Presence of left artificial hip joint: Secondary | ICD-10-CM | POA: Diagnosis not present

## 2020-12-16 DIAGNOSIS — M5126 Other intervertebral disc displacement, lumbar region: Secondary | ICD-10-CM | POA: Diagnosis not present

## 2020-12-16 DIAGNOSIS — R29898 Other symptoms and signs involving the musculoskeletal system: Secondary | ICD-10-CM | POA: Diagnosis not present

## 2020-12-16 DIAGNOSIS — R2689 Other abnormalities of gait and mobility: Secondary | ICD-10-CM | POA: Diagnosis not present

## 2020-12-16 DIAGNOSIS — B965 Pseudomonas (aeruginosa) (mallei) (pseudomallei) as the cause of diseases classified elsewhere: Secondary | ICD-10-CM | POA: Diagnosis not present

## 2020-12-16 DIAGNOSIS — R338 Other retention of urine: Secondary | ICD-10-CM | POA: Diagnosis not present

## 2020-12-16 DIAGNOSIS — M5186 Other intervertebral disc disorders, lumbar region: Secondary | ICD-10-CM | POA: Diagnosis not present

## 2020-12-16 DIAGNOSIS — I10 Essential (primary) hypertension: Secondary | ICD-10-CM | POA: Diagnosis not present

## 2020-12-16 DIAGNOSIS — R29702 NIHSS score 2: Secondary | ICD-10-CM | POA: Diagnosis not present

## 2020-12-16 DIAGNOSIS — Z466 Encounter for fitting and adjustment of urinary device: Secondary | ICD-10-CM | POA: Diagnosis not present

## 2020-12-16 DIAGNOSIS — Z7982 Long term (current) use of aspirin: Secondary | ICD-10-CM | POA: Diagnosis not present

## 2020-12-16 DIAGNOSIS — R200 Anesthesia of skin: Secondary | ICD-10-CM | POA: Diagnosis not present

## 2020-12-16 DIAGNOSIS — G4700 Insomnia, unspecified: Secondary | ICD-10-CM | POA: Diagnosis not present

## 2020-12-16 DIAGNOSIS — E785 Hyperlipidemia, unspecified: Secondary | ICD-10-CM | POA: Diagnosis not present

## 2020-12-16 DIAGNOSIS — I358 Other nonrheumatic aortic valve disorders: Secondary | ICD-10-CM | POA: Diagnosis not present

## 2020-12-16 DIAGNOSIS — E559 Vitamin D deficiency, unspecified: Secondary | ICD-10-CM | POA: Diagnosis not present

## 2020-12-16 DIAGNOSIS — N390 Urinary tract infection, site not specified: Secondary | ICD-10-CM | POA: Diagnosis not present

## 2020-12-16 DIAGNOSIS — E063 Autoimmune thyroiditis: Secondary | ICD-10-CM | POA: Diagnosis not present

## 2020-12-16 DIAGNOSIS — I6312 Cerebral infarction due to embolism of basilar artery: Secondary | ICD-10-CM | POA: Diagnosis not present

## 2020-12-16 DIAGNOSIS — Z888 Allergy status to other drugs, medicaments and biological substances status: Secondary | ICD-10-CM | POA: Diagnosis not present

## 2020-12-16 DIAGNOSIS — G459 Transient cerebral ischemic attack, unspecified: Secondary | ICD-10-CM | POA: Diagnosis not present

## 2020-12-16 DIAGNOSIS — G319 Degenerative disease of nervous system, unspecified: Secondary | ICD-10-CM | POA: Diagnosis not present

## 2020-12-16 DIAGNOSIS — I639 Cerebral infarction, unspecified: Secondary | ICD-10-CM | POA: Diagnosis not present

## 2020-12-16 DIAGNOSIS — E538 Deficiency of other specified B group vitamins: Secondary | ICD-10-CM | POA: Diagnosis not present

## 2020-12-17 DIAGNOSIS — R338 Other retention of urine: Secondary | ICD-10-CM | POA: Diagnosis not present

## 2020-12-17 DIAGNOSIS — I679 Cerebrovascular disease, unspecified: Secondary | ICD-10-CM | POA: Diagnosis not present

## 2020-12-27 DIAGNOSIS — E538 Deficiency of other specified B group vitamins: Secondary | ICD-10-CM | POA: Diagnosis not present

## 2020-12-27 DIAGNOSIS — E568 Deficiency of other vitamins: Secondary | ICD-10-CM | POA: Diagnosis not present

## 2020-12-27 DIAGNOSIS — N3001 Acute cystitis with hematuria: Secondary | ICD-10-CM | POA: Diagnosis not present

## 2020-12-27 DIAGNOSIS — E038 Other specified hypothyroidism: Secondary | ICD-10-CM | POA: Diagnosis not present

## 2020-12-27 DIAGNOSIS — R3129 Other microscopic hematuria: Secondary | ICD-10-CM | POA: Diagnosis not present

## 2020-12-27 DIAGNOSIS — G4700 Insomnia, unspecified: Secondary | ICD-10-CM | POA: Diagnosis not present

## 2020-12-27 DIAGNOSIS — Z20822 Contact with and (suspected) exposure to covid-19: Secondary | ICD-10-CM | POA: Diagnosis not present

## 2020-12-27 DIAGNOSIS — E785 Hyperlipidemia, unspecified: Secondary | ICD-10-CM | POA: Diagnosis not present

## 2020-12-27 DIAGNOSIS — Z8781 Personal history of (healed) traumatic fracture: Secondary | ICD-10-CM | POA: Diagnosis not present

## 2020-12-27 DIAGNOSIS — G4709 Other insomnia: Secondary | ICD-10-CM | POA: Diagnosis not present

## 2020-12-27 DIAGNOSIS — D638 Anemia in other chronic diseases classified elsewhere: Secondary | ICD-10-CM | POA: Diagnosis not present

## 2020-12-27 DIAGNOSIS — E559 Vitamin D deficiency, unspecified: Secondary | ICD-10-CM | POA: Diagnosis not present

## 2020-12-27 DIAGNOSIS — I6782 Cerebral ischemia: Secondary | ICD-10-CM | POA: Diagnosis not present

## 2020-12-27 DIAGNOSIS — I6389 Other cerebral infarction: Secondary | ICD-10-CM | POA: Diagnosis not present

## 2020-12-27 DIAGNOSIS — Z87891 Personal history of nicotine dependence: Secondary | ICD-10-CM | POA: Diagnosis not present

## 2020-12-27 DIAGNOSIS — N138 Other obstructive and reflux uropathy: Secondary | ICD-10-CM | POA: Diagnosis not present

## 2020-12-27 DIAGNOSIS — R1084 Generalized abdominal pain: Secondary | ICD-10-CM | POA: Diagnosis not present

## 2020-12-27 DIAGNOSIS — Z79899 Other long term (current) drug therapy: Secondary | ICD-10-CM | POA: Diagnosis not present

## 2020-12-27 DIAGNOSIS — D6489 Other specified anemias: Secondary | ICD-10-CM | POA: Diagnosis not present

## 2020-12-27 DIAGNOSIS — E063 Autoimmune thyroiditis: Secondary | ICD-10-CM | POA: Diagnosis not present

## 2020-12-27 DIAGNOSIS — R339 Retention of urine, unspecified: Secondary | ICD-10-CM | POA: Diagnosis not present

## 2020-12-27 DIAGNOSIS — E039 Hypothyroidism, unspecified: Secondary | ICD-10-CM | POA: Diagnosis not present

## 2020-12-27 DIAGNOSIS — D72828 Other elevated white blood cell count: Secondary | ICD-10-CM | POA: Diagnosis not present

## 2020-12-27 DIAGNOSIS — Z7982 Long term (current) use of aspirin: Secondary | ICD-10-CM | POA: Diagnosis not present

## 2020-12-27 DIAGNOSIS — Z96642 Presence of left artificial hip joint: Secondary | ICD-10-CM | POA: Diagnosis not present

## 2020-12-27 DIAGNOSIS — Z436 Encounter for attention to other artificial openings of urinary tract: Secondary | ICD-10-CM | POA: Diagnosis not present

## 2020-12-27 DIAGNOSIS — Z91048 Other nonmedicinal substance allergy status: Secondary | ICD-10-CM | POA: Diagnosis not present

## 2020-12-27 DIAGNOSIS — R319 Hematuria, unspecified: Secondary | ICD-10-CM | POA: Diagnosis not present

## 2020-12-27 DIAGNOSIS — R278 Other lack of coordination: Secondary | ICD-10-CM | POA: Diagnosis not present

## 2020-12-27 DIAGNOSIS — N139 Obstructive and reflux uropathy, unspecified: Secondary | ICD-10-CM | POA: Diagnosis not present

## 2020-12-27 DIAGNOSIS — R338 Other retention of urine: Secondary | ICD-10-CM | POA: Diagnosis not present

## 2020-12-27 DIAGNOSIS — Z466 Encounter for fitting and adjustment of urinary device: Secondary | ICD-10-CM | POA: Diagnosis not present

## 2020-12-27 DIAGNOSIS — R799 Abnormal finding of blood chemistry, unspecified: Secondary | ICD-10-CM | POA: Diagnosis not present

## 2020-12-27 DIAGNOSIS — G3184 Mild cognitive impairment, so stated: Secondary | ICD-10-CM | POA: Diagnosis not present

## 2020-12-27 DIAGNOSIS — R310 Gross hematuria: Secondary | ICD-10-CM | POA: Diagnosis not present

## 2020-12-27 DIAGNOSIS — I6789 Other cerebrovascular disease: Secondary | ICD-10-CM | POA: Diagnosis not present

## 2020-12-27 DIAGNOSIS — D509 Iron deficiency anemia, unspecified: Secondary | ICD-10-CM | POA: Diagnosis not present

## 2020-12-27 DIAGNOSIS — Z7189 Other specified counseling: Secondary | ICD-10-CM | POA: Diagnosis not present

## 2020-12-27 DIAGNOSIS — Z66 Do not resuscitate: Secondary | ICD-10-CM | POA: Diagnosis not present

## 2020-12-27 DIAGNOSIS — I639 Cerebral infarction, unspecified: Secondary | ICD-10-CM | POA: Diagnosis not present

## 2020-12-27 DIAGNOSIS — R2689 Other abnormalities of gait and mobility: Secondary | ICD-10-CM | POA: Diagnosis not present

## 2020-12-27 DIAGNOSIS — I10 Essential (primary) hypertension: Secondary | ICD-10-CM | POA: Diagnosis not present

## 2020-12-27 DIAGNOSIS — R5081 Fever presenting with conditions classified elsewhere: Secondary | ICD-10-CM | POA: Diagnosis not present

## 2020-12-27 DIAGNOSIS — I69359 Hemiplegia and hemiparesis following cerebral infarction affecting unspecified side: Secondary | ICD-10-CM | POA: Diagnosis not present

## 2020-12-27 DIAGNOSIS — Z8673 Personal history of transient ischemic attack (TIA), and cerebral infarction without residual deficits: Secondary | ICD-10-CM | POA: Diagnosis not present

## 2020-12-27 DIAGNOSIS — R58 Hemorrhage, not elsewhere classified: Secondary | ICD-10-CM | POA: Diagnosis not present

## 2020-12-27 DIAGNOSIS — I69354 Hemiplegia and hemiparesis following cerebral infarction affecting left non-dominant side: Secondary | ICD-10-CM | POA: Diagnosis not present

## 2020-12-27 DIAGNOSIS — M5136 Other intervertebral disc degeneration, lumbar region: Secondary | ICD-10-CM | POA: Diagnosis not present

## 2020-12-27 DIAGNOSIS — N3080 Other cystitis without hematuria: Secondary | ICD-10-CM | POA: Diagnosis not present

## 2020-12-27 DIAGNOSIS — N390 Urinary tract infection, site not specified: Secondary | ICD-10-CM | POA: Diagnosis not present

## 2020-12-29 DIAGNOSIS — I6389 Other cerebral infarction: Secondary | ICD-10-CM | POA: Diagnosis not present

## 2020-12-29 DIAGNOSIS — R338 Other retention of urine: Secondary | ICD-10-CM | POA: Diagnosis not present

## 2020-12-29 DIAGNOSIS — Z66 Do not resuscitate: Secondary | ICD-10-CM | POA: Diagnosis not present

## 2020-12-29 DIAGNOSIS — E568 Deficiency of other vitamins: Secondary | ICD-10-CM | POA: Diagnosis not present

## 2020-12-29 DIAGNOSIS — E038 Other specified hypothyroidism: Secondary | ICD-10-CM | POA: Diagnosis not present

## 2020-12-29 DIAGNOSIS — E538 Deficiency of other specified B group vitamins: Secondary | ICD-10-CM | POA: Diagnosis not present

## 2020-12-29 DIAGNOSIS — Z7189 Other specified counseling: Secondary | ICD-10-CM | POA: Diagnosis not present

## 2020-12-29 DIAGNOSIS — I6782 Cerebral ischemia: Secondary | ICD-10-CM | POA: Diagnosis not present

## 2021-01-12 DIAGNOSIS — R338 Other retention of urine: Secondary | ICD-10-CM | POA: Diagnosis not present

## 2021-01-27 DIAGNOSIS — E039 Hypothyroidism, unspecified: Secondary | ICD-10-CM | POA: Diagnosis not present

## 2021-01-27 DIAGNOSIS — D638 Anemia in other chronic diseases classified elsewhere: Secondary | ICD-10-CM | POA: Diagnosis not present

## 2021-01-27 DIAGNOSIS — I6782 Cerebral ischemia: Secondary | ICD-10-CM | POA: Diagnosis not present

## 2021-01-27 DIAGNOSIS — I69359 Hemiplegia and hemiparesis following cerebral infarction affecting unspecified side: Secondary | ICD-10-CM | POA: Diagnosis not present

## 2021-01-31 DIAGNOSIS — I6782 Cerebral ischemia: Secondary | ICD-10-CM | POA: Diagnosis not present

## 2021-01-31 DIAGNOSIS — G4709 Other insomnia: Secondary | ICD-10-CM | POA: Diagnosis not present

## 2021-02-09 DIAGNOSIS — R799 Abnormal finding of blood chemistry, unspecified: Secondary | ICD-10-CM | POA: Diagnosis not present

## 2021-02-12 DIAGNOSIS — Z91048 Other nonmedicinal substance allergy status: Secondary | ICD-10-CM | POA: Diagnosis not present

## 2021-02-12 DIAGNOSIS — Z8781 Personal history of (healed) traumatic fracture: Secondary | ICD-10-CM | POA: Diagnosis not present

## 2021-02-12 DIAGNOSIS — Z87891 Personal history of nicotine dependence: Secondary | ICD-10-CM | POA: Diagnosis not present

## 2021-02-12 DIAGNOSIS — R319 Hematuria, unspecified: Secondary | ICD-10-CM | POA: Diagnosis not present

## 2021-02-12 DIAGNOSIS — E063 Autoimmune thyroiditis: Secondary | ICD-10-CM | POA: Diagnosis not present

## 2021-02-12 DIAGNOSIS — Z79899 Other long term (current) drug therapy: Secondary | ICD-10-CM | POA: Diagnosis not present

## 2021-02-12 DIAGNOSIS — Z8673 Personal history of transient ischemic attack (TIA), and cerebral infarction without residual deficits: Secondary | ICD-10-CM | POA: Diagnosis not present

## 2021-02-12 DIAGNOSIS — Z7982 Long term (current) use of aspirin: Secondary | ICD-10-CM | POA: Diagnosis not present

## 2021-02-12 DIAGNOSIS — Z466 Encounter for fitting and adjustment of urinary device: Secondary | ICD-10-CM | POA: Diagnosis not present

## 2021-02-12 DIAGNOSIS — Z436 Encounter for attention to other artificial openings of urinary tract: Secondary | ICD-10-CM | POA: Diagnosis not present

## 2021-02-14 DIAGNOSIS — R5081 Fever presenting with conditions classified elsewhere: Secondary | ICD-10-CM | POA: Diagnosis not present

## 2021-02-14 DIAGNOSIS — E038 Other specified hypothyroidism: Secondary | ICD-10-CM | POA: Diagnosis not present

## 2021-02-14 DIAGNOSIS — Z20822 Contact with and (suspected) exposure to covid-19: Secondary | ICD-10-CM | POA: Diagnosis not present

## 2021-02-14 DIAGNOSIS — D638 Anemia in other chronic diseases classified elsewhere: Secondary | ICD-10-CM | POA: Diagnosis not present

## 2021-02-15 DIAGNOSIS — N390 Urinary tract infection, site not specified: Secondary | ICD-10-CM | POA: Diagnosis not present

## 2021-02-17 DIAGNOSIS — N3080 Other cystitis without hematuria: Secondary | ICD-10-CM | POA: Diagnosis not present

## 2021-02-17 DIAGNOSIS — R310 Gross hematuria: Secondary | ICD-10-CM | POA: Diagnosis not present

## 2021-02-17 DIAGNOSIS — D638 Anemia in other chronic diseases classified elsewhere: Secondary | ICD-10-CM | POA: Diagnosis not present

## 2021-02-17 DIAGNOSIS — D72828 Other elevated white blood cell count: Secondary | ICD-10-CM | POA: Diagnosis not present

## 2021-02-18 DIAGNOSIS — R799 Abnormal finding of blood chemistry, unspecified: Secondary | ICD-10-CM | POA: Diagnosis not present

## 2021-02-21 DIAGNOSIS — R799 Abnormal finding of blood chemistry, unspecified: Secondary | ICD-10-CM | POA: Diagnosis not present

## 2021-02-21 DIAGNOSIS — N138 Other obstructive and reflux uropathy: Secondary | ICD-10-CM | POA: Diagnosis not present

## 2021-02-21 DIAGNOSIS — D6489 Other specified anemias: Secondary | ICD-10-CM | POA: Diagnosis not present

## 2021-02-21 DIAGNOSIS — R3129 Other microscopic hematuria: Secondary | ICD-10-CM | POA: Diagnosis not present

## 2021-02-21 DIAGNOSIS — R2689 Other abnormalities of gait and mobility: Secondary | ICD-10-CM | POA: Diagnosis not present

## 2021-02-21 DIAGNOSIS — E038 Other specified hypothyroidism: Secondary | ICD-10-CM | POA: Diagnosis not present

## 2021-02-21 DIAGNOSIS — I6389 Other cerebral infarction: Secondary | ICD-10-CM | POA: Diagnosis not present

## 2021-02-21 DIAGNOSIS — I6789 Other cerebrovascular disease: Secondary | ICD-10-CM | POA: Diagnosis not present

## 2021-02-21 DIAGNOSIS — Z20822 Contact with and (suspected) exposure to covid-19: Secondary | ICD-10-CM | POA: Diagnosis not present

## 2021-02-21 DIAGNOSIS — D72828 Other elevated white blood cell count: Secondary | ICD-10-CM | POA: Diagnosis not present

## 2021-02-21 DIAGNOSIS — I69359 Hemiplegia and hemiparesis following cerebral infarction affecting unspecified side: Secondary | ICD-10-CM | POA: Diagnosis not present

## 2021-03-02 DIAGNOSIS — N3001 Acute cystitis with hematuria: Secondary | ICD-10-CM | POA: Diagnosis not present

## 2021-03-02 DIAGNOSIS — N390 Urinary tract infection, site not specified: Secondary | ICD-10-CM | POA: Diagnosis not present

## 2021-03-02 DIAGNOSIS — I6782 Cerebral ischemia: Secondary | ICD-10-CM | POA: Diagnosis not present

## 2021-03-02 DIAGNOSIS — R339 Retention of urine, unspecified: Secondary | ICD-10-CM | POA: Diagnosis not present

## 2021-03-02 DIAGNOSIS — N139 Obstructive and reflux uropathy, unspecified: Secondary | ICD-10-CM | POA: Diagnosis not present

## 2021-03-11 DIAGNOSIS — R799 Abnormal finding of blood chemistry, unspecified: Secondary | ICD-10-CM | POA: Diagnosis not present

## 2021-03-18 ENCOUNTER — Other Ambulatory Visit (HOSPITAL_COMMUNITY): Payer: Self-pay | Admitting: Urology

## 2021-03-18 ENCOUNTER — Encounter (HOSPITAL_COMMUNITY): Payer: Self-pay | Admitting: Radiology

## 2021-03-18 DIAGNOSIS — Q541 Hypospadias, penile: Secondary | ICD-10-CM

## 2021-03-18 DIAGNOSIS — R338 Other retention of urine: Secondary | ICD-10-CM | POA: Diagnosis not present

## 2021-03-18 NOTE — Progress Notes (Signed)
Patient Demographics  Patient Name  Glenn Mendez, [employer] Legal Sex  Male DOB  07-11-1932 SSN  [id number] Address  [address]  [place] [address] Phone  [phone] (Home)     FW: CT Drain Placement / Suprapubic tube placement Received: Today Oley Balm, MD  Servando Salina Ok   CT suprapubic catheter placement  Needs FOley catheter preprocedure   DDH         Previous Messages    ----- Message -----  From: Oley Balm, MD  Sent: 03/18/2021   1:53 PM EDT  To:  Subject: RE: CT Drain Placement / Suprapubic tube pla*     ----- Message -----  From: Henry Russel D  Sent: 03/18/2021   1:28 PM EDT  To: Ir Procedure Requests  Subject: CT Drain Placement / Suprapubic tube placeme*   Procedure:  CT Drain / Suprapubic Tube Placement   Reason:  Areflexic Bladder, Hypospadias, penile, suprapubic tube placement, patient with history of stroke   History:  no prior imaging   Provider: Rhoderick Moody   Provider Contact:  [phone]

## 2021-03-22 ENCOUNTER — Ambulatory Visit (HOSPITAL_COMMUNITY): Payer: Medicare Other

## 2021-03-25 DIAGNOSIS — R799 Abnormal finding of blood chemistry, unspecified: Secondary | ICD-10-CM | POA: Diagnosis not present

## 2021-04-01 ENCOUNTER — Other Ambulatory Visit: Payer: Self-pay | Admitting: Physician Assistant

## 2021-04-04 ENCOUNTER — Other Ambulatory Visit: Payer: Self-pay

## 2021-04-04 ENCOUNTER — Encounter (HOSPITAL_COMMUNITY): Payer: Self-pay

## 2021-04-04 ENCOUNTER — Ambulatory Visit (HOSPITAL_COMMUNITY)
Admission: RE | Admit: 2021-04-04 | Discharge: 2021-04-04 | Disposition: A | Discharge status: Home / Self Care | Payer: Medicare Other | Source: Ambulatory Visit | Attending: Urology | Admitting: Urology

## 2021-04-04 DIAGNOSIS — N401 Enlarged prostate with lower urinary tract symptoms: Secondary | ICD-10-CM | POA: Insufficient documentation

## 2021-04-04 DIAGNOSIS — Z79899 Other long term (current) drug therapy: Secondary | ICD-10-CM | POA: Diagnosis not present

## 2021-04-04 DIAGNOSIS — Q541 Hypospadias, penile: Secondary | ICD-10-CM | POA: Diagnosis not present

## 2021-04-04 DIAGNOSIS — N3289 Other specified disorders of bladder: Secondary | ICD-10-CM | POA: Diagnosis not present

## 2021-04-04 DIAGNOSIS — Z8673 Personal history of transient ischemic attack (TIA), and cerebral infarction without residual deficits: Secondary | ICD-10-CM | POA: Diagnosis not present

## 2021-04-04 DIAGNOSIS — Z8679 Personal history of other diseases of the circulatory system: Secondary | ICD-10-CM | POA: Diagnosis not present

## 2021-04-04 DIAGNOSIS — Z79890 Hormone replacement therapy (postmenopausal): Secondary | ICD-10-CM | POA: Insufficient documentation

## 2021-04-04 DIAGNOSIS — Z435 Encounter for attention to cystostomy: Secondary | ICD-10-CM | POA: Diagnosis not present

## 2021-04-04 LAB — CBC
HCT: 39 % (ref 39.0–52.0)
Hemoglobin: 12 g/dL — ABNORMAL LOW (ref 13.0–17.0)
MCH: 26 pg (ref 26.0–34.0)
MCHC: 30.8 g/dL (ref 30.0–36.0)
MCV: 84.4 fL (ref 80.0–100.0)
Platelets: 382 10*3/uL (ref 150–400)
RBC: 4.62 MIL/uL (ref 4.22–5.81)
RDW: 15.7 % — ABNORMAL HIGH (ref 11.5–15.5)
WBC: 9 10*3/uL (ref 4.0–10.5)
nRBC: 0 % (ref 0.0–0.2)

## 2021-04-04 LAB — PROTIME-INR
INR: 1.2 (ref 0.8–1.2)
Prothrombin Time: 15.2 seconds (ref 11.4–15.2)

## 2021-04-04 IMAGING — CT CT IMAGE GUIDED DRAINAGE BY PERCUTANEOUS CATHETER
2 of 4 series · 13 of 32 positions shown, 18 images · non-contrast
Comparison: None.

INDICATION: 88-year-old male with history of stroke and a reflected bladder
requiring suprapubic catheter placement.

EXAM:
CT-guided suprapubic catheter placement

[Series 2: i-spiral 5.0 b40f · axial · 0.91mm/px · z∈[+832,+979]mm · 8 of 54 slices shown, 13 images (1 of 2)]
[im 6/54  soft-tissue]
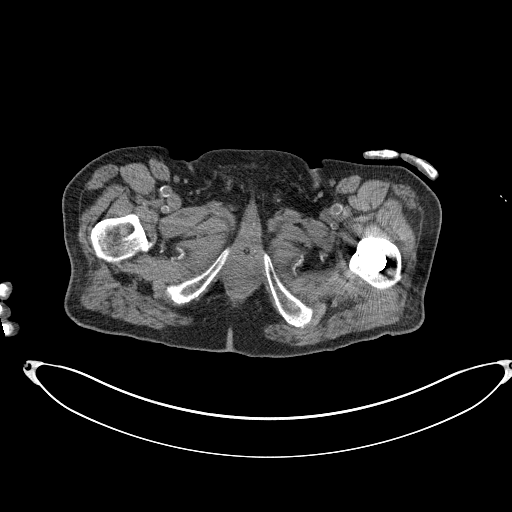
[im 6/54  bone]
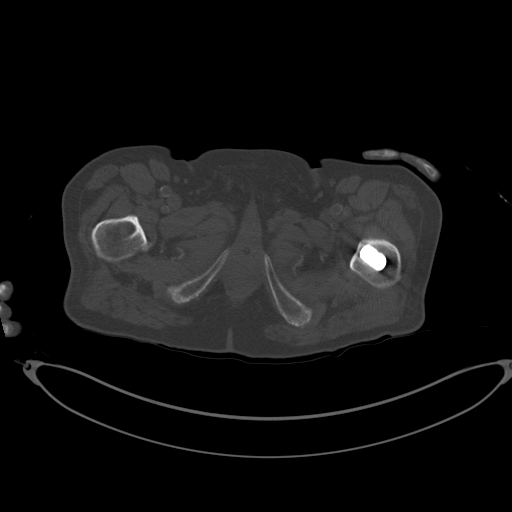
[im 12/54  soft-tissue]
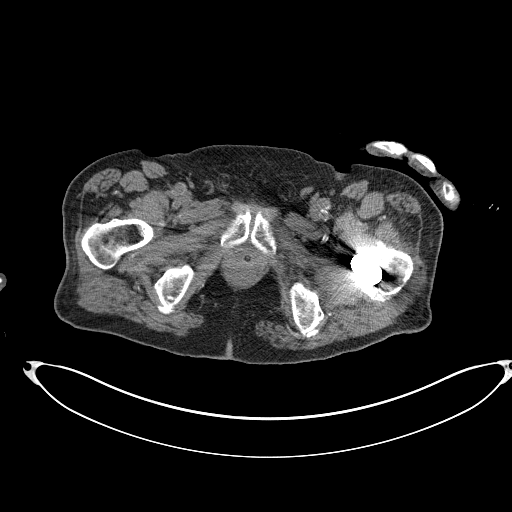
[im 18/54  soft-tissue]
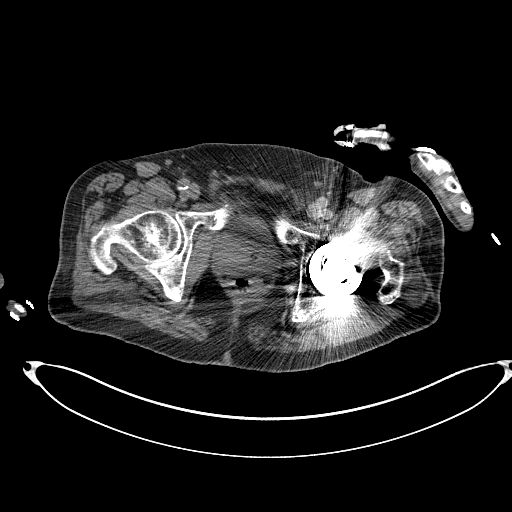
[im 24/54  soft-tissue]
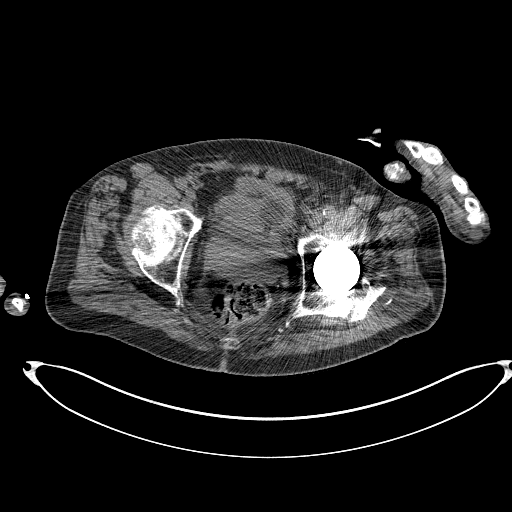
[im 30/54  soft-tissue]
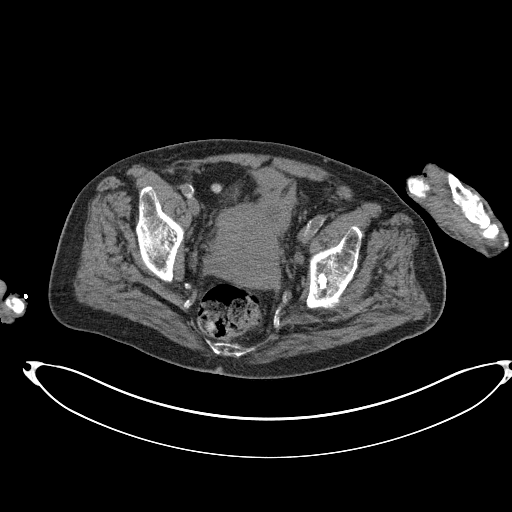
[im 30/54  lung]
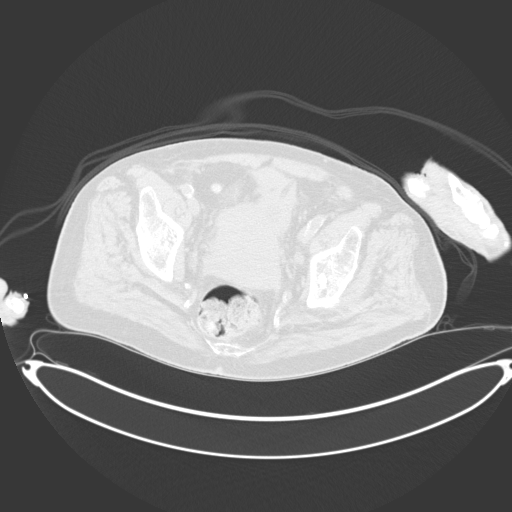
[im 36/54  soft-tissue]
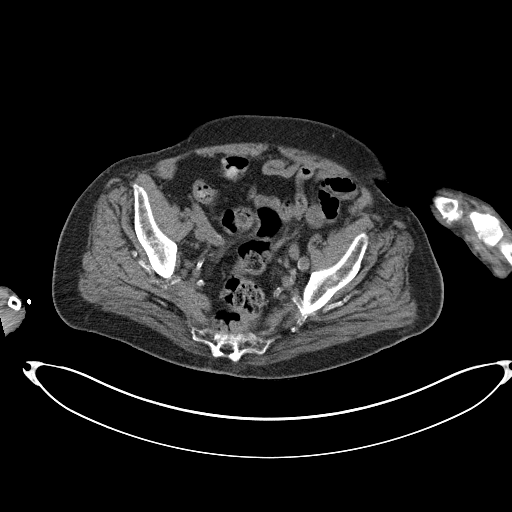
[im 36/54  lung]
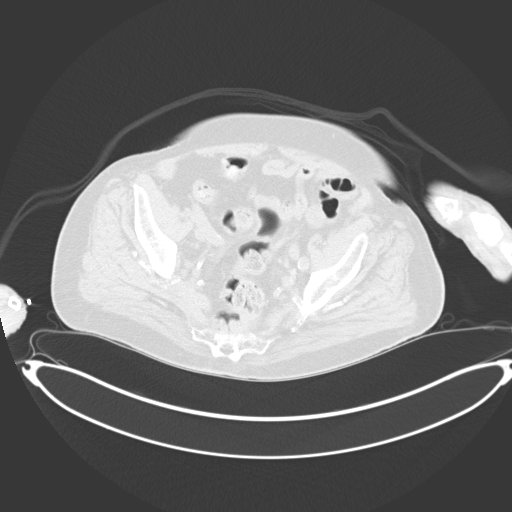
[im 42/54  soft-tissue]
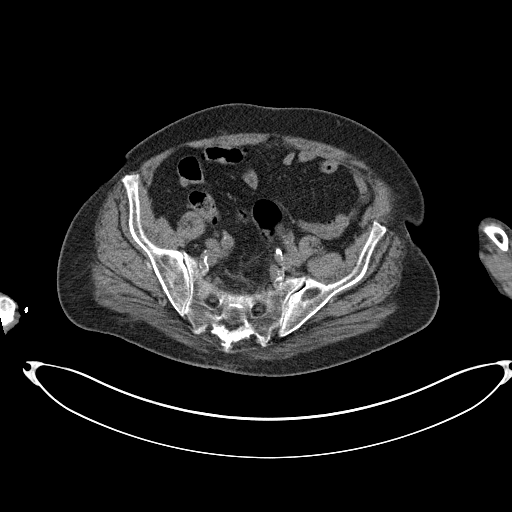
[im 42/54  lung]
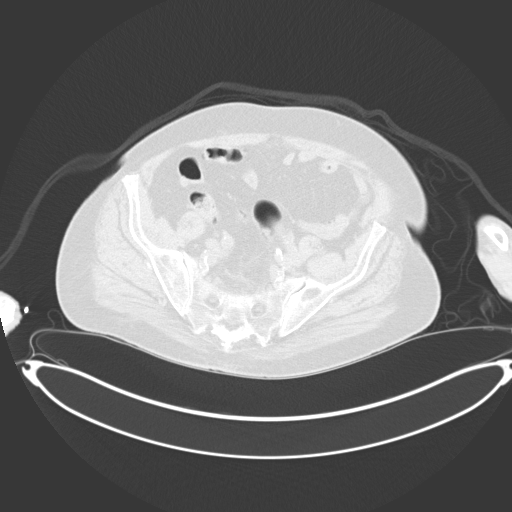
[im 48/54  soft-tissue]
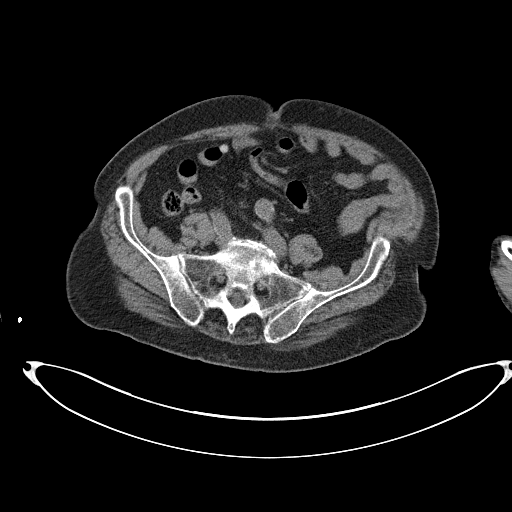
[im 48/54  lung]
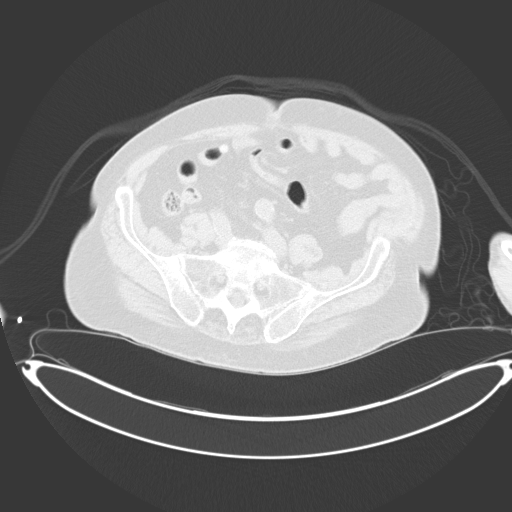

[Series 3: i-spiral 5.0 b40f · axial · 0.91mm/px · z∈[+836,+930]mm · 5 of 54 slices shown (2 of 2)]
[im 7/54  soft-tissue]
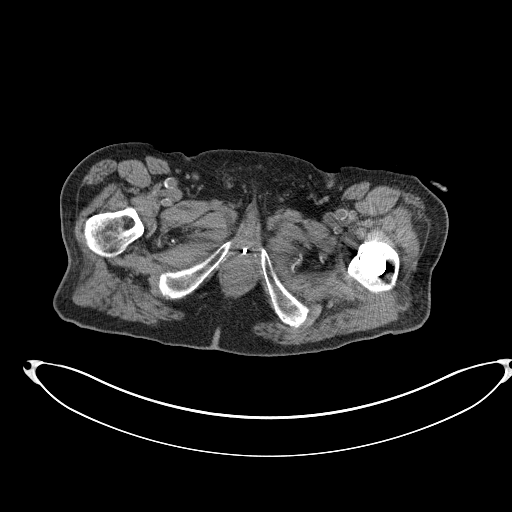
[im 14/54  soft-tissue]
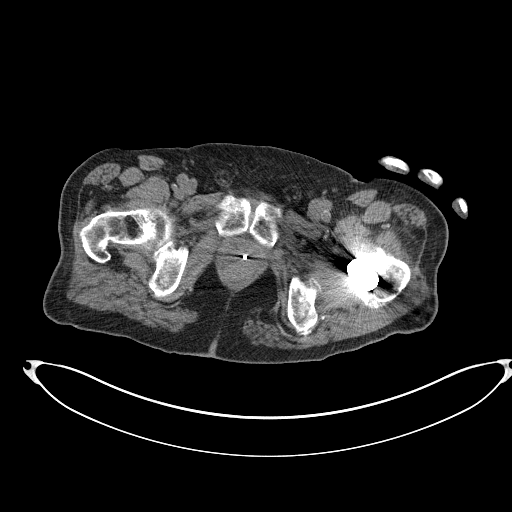
[im 20/54  soft-tissue]
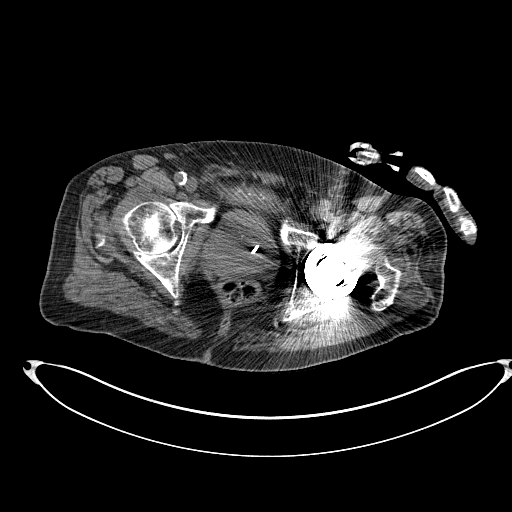
[im 27/54  soft-tissue]
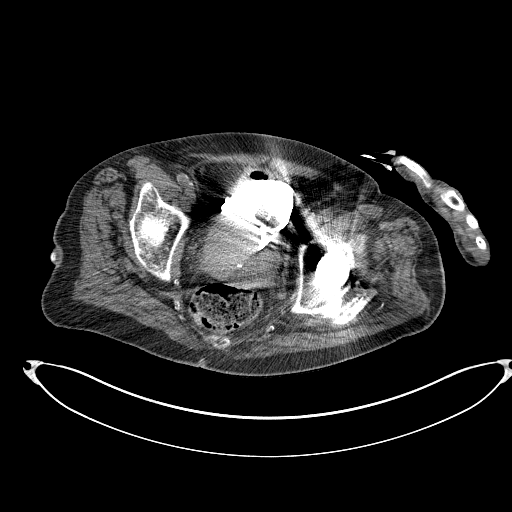
[im 34/54  soft-tissue]
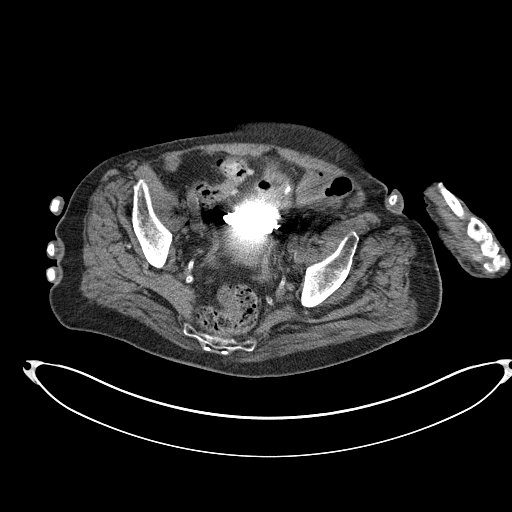

[13 of 32 positions shown; findings below may reference images not displayed]

MEDICATIONS:
None.

ANESTHESIA/SEDATION:
Fentanyl 75 mcg IV; Versed 1 mg IV

Moderate Sedation Time:  17

The patient was continuously monitored during the procedure by the
interventional radiology nurse under my direct supervision.

CONTRAST:  30 mL-administered into the collecting system(s)

FLUOROSCOPY TIME:  None.

COMPLICATIONS:
None immediate.

PROCEDURE:
Informed written consent was obtained from the patient after a
discussion of the risks, benefits and alternatives to treatment. The
patient was placed supine on the CT gantry and a pre procedural CT
was performed which demonstrated a partially distended bladder with
indwelling trans urethral Foley catheter. The procedure was planned.
A timeout was performed prior to the initiation of the procedure.

The suprapubic region was prepped and draped in the usual sterile
fashion. The overlying soft tissues were anesthetized with 1%
lidocaine with epinephrine. Appropriate trajectory was planned with
the use of a 22 gauge spinal needle. An 18 gauge trocar needle was
advanced into the urinary bladder and a short Amplatz super stiff
wire was coiled within the bladder. Appropriate positioning was
confirmed with a limited CT scan. The tract was serially dilated
allowing placement of a 14 French all-purpose pigtail drainage
catheter. Appropriate positioning was confirmed with a limited
postprocedural CT scan.

There was clear urine returned through the newly placed tube. The
tube was connected to a drainage bag and sutured in place. A
dressing was placed. The patient tolerated the procedure well
without immediate post procedural complication.
IMPRESSION: Successful CT-guided placement of 14 French pigtail suprapubic
catheter.

## 2021-04-04 MED ORDER — FENTANYL CITRATE (PF) 100 MCG/2ML IJ SOLN
INTRAMUSCULAR | Status: AC
  Filled 2021-04-04: qty 2

## 2021-04-04 MED ORDER — FENTANYL CITRATE (PF) 100 MCG/2ML IJ SOLN
INTRAMUSCULAR | Status: AC | PRN
  Administered 2021-04-04: 50 ug via INTRAVENOUS
  Administered 2021-04-04: 25 ug via INTRAVENOUS

## 2021-04-04 MED ORDER — SODIUM CHLORIDE 0.9% FLUSH: 5.0000 mL | Freq: Three times a day (TID) | INTRAVENOUS | Status: DC

## 2021-04-04 MED ORDER — LIDOCAINE-EPINEPHRINE 1 %-1:100000 IJ SOLN: 10.0000 mL | Freq: Once | INTRAMUSCULAR | Status: DC

## 2021-04-04 MED ORDER — MIDAZOLAM HCL 2 MG/2ML IJ SOLN
INTRAMUSCULAR | Status: AC | PRN
  Administered 2021-04-04: 1 mg via INTRAVENOUS

## 2021-04-04 MED ORDER — LIDOCAINE-EPINEPHRINE 1 %-1:100000 IJ SOLN
INTRAMUSCULAR | Status: AC
  Filled 2021-04-04: qty 1

## 2021-04-04 MED ORDER — MIDAZOLAM HCL 2 MG/2ML IJ SOLN
INTRAMUSCULAR | Status: AC
  Filled 2021-04-04: qty 2

## 2021-04-04 MED ORDER — SODIUM CHLORIDE 0.9 % IV SOLN: INTRAVENOUS | Status: DC

## 2021-04-04 NOTE — Procedures (Signed)
Interventional Radiology Procedure Note  Procedure: CT guided suprapubic catheter placement  Findings: Please refer to procedural dictation for full description. 14 Fr pigtail suprapubic catheter placed.  Foley catheter removed.    Complications: None immediate  Estimated Blood Loss: < 5 mL  Recommendations: Keep to bag drainage.  Follow up/upsize plan as per Urology.   Marliss Coots, MD

## 2021-04-04 NOTE — Progress Notes (Signed)
Report given to Reedy at [facility].

## 2021-04-04 NOTE — H&P (Signed)
Chief Complaint: Patient was seen in consultation today for supra pubic catheter placement at the request of GlennChristopher Clifton Mendez   Supervising Physician: Marliss Coots  Patient Status: [facility] - Out-pt  History of Present Illness: Glenn Mendez is a 87 y.o. male   BPH; post TURP Foley cath since 2014 CVA 2021 Areflexic bladder Scheduled today for supra pubic catheter placement per Dr Salena Saner Liliane Shi- Urologist   Past Medical History:  Diagnosis Date   BPH (benign prostatic hyperplasia)    Duodenal ulcer with hemorrhage 1957   Foley catheter in place 04/17/13   History of blood transfusion    Renal failure    Sigmoid diverticulosis     Past Surgical History:  Procedure Laterality Date   COLONOSCOPY W/ POLYPECTOMY     HERNIA REPAIR     INSERTION OF SUPRAPUBIC CATHETER N/A 04/23/2013   Procedure: INSERTION OF SUPRAPUBIC CATHETER;  Surgeon: Valetta Fuller, MD;  Location: [facility] ORS;  Service: Urology;  Laterality: N/A;   TONSILLECTOMY     TRANSURETHRAL RESECTION OF PROSTATE N/A 04/23/2013   Procedure: TRANSURETHRAL RESECTION OF THE PROSTATE WITH GYRUS INSTRUMENTS;  Surgeon: Valetta Fuller, MD;  Location: [facility];  Service: Urology;  Laterality: N/A;    Allergies: Cat hair extract  Medications: Prior to Admission medications   Medication Sig Start Date End Date Taking? Authorizing Provider  acidophilus (RISAQUAD) CAPS capsule Take by mouth daily.   Yes [provider]  atorvastatin (LIPITOR) 40 MG tablet Take 40 mg by mouth at bedtime.   Yes [provider]  ferrous sulfate 325 (65 FE) MG tablet Take 325 mg by mouth daily with breakfast.   Yes [provider]  levothyroxine (SYNTHROID) 75 MCG tablet Take 37.5 mcg by mouth daily before breakfast.   Yes [provider]  melatonin 3 MG TABS tablet Take 3 mg by mouth at bedtime.   Yes [provider]  Multiple Vitamin (MULTIVITAMIN WITH MINERALS) TABS tablet Take 1 tablet by mouth daily.    Yes [provider]  Skin Protectants, Misc. (BAZA PROTECT EX) Apply 1 application topically 3 (three) times daily.   Yes [provider]  tamsulosin (FLOMAX) 0.4 MG CAPS capsule Take 0.4 mg by mouth daily.   Yes [provider]  vitamin B-12 (CYANOCOBALAMIN) 1000 MCG tablet Take 2,000 mcg by mouth daily.   Yes [provider]  Vitamin D, Ergocalciferol, (DRISDOL) 1.25 MG (50000 UNIT) CAPS capsule Take 50,000 Units by mouth every 30 (thirty) days.   Yes [provider]  acetaminophen (TYLENOL) 325 MG tablet Take 650 mg by mouth every 6 (six) hours as needed for moderate pain or fever.    [provider]  bisacodyl (DULCOLAX) 5 MG EC tablet Take 5 mg by mouth daily as needed for moderate constipation.    [provider]     Family History  Problem Relation Age of Onset   Stroke Father     Social History   Socioeconomic History   Marital status: Married    Spouse name: Not on file   Number of children: 3   Years of education: Not on file   Highest education level: Not on file  Occupational History   Occupation: Retired  Tobacco Use   Smoking status: Never   Smokeless tobacco: Never  Substance and Sexual Activity   Alcohol use: Yes    Alcohol/week: 32.0 standard drinks    Types: 32 Shots of liquor per week    Comment: 4 ounces  burbon 3-4 x week and has done this for years.   Drug use: No   Sexual activity: Not on file  Other Topics Concern   Not on file  Social History Narrative   Not on file   Social Determinants of Health   Financial Resource Strain: Not on file  Food Insecurity: Not on file  Transportation Needs: Not on file  Physical Activity: Not on file  Stress: Not on file  Social Connections: Not on file    Review of Systems: A 12 point ROS discussed and pertinent positives are indicated in the HPI above.  All other systems are negative.  Review of Systems  Constitutional:  Negative for fever.   Respiratory:  Negative for cough and shortness of breath.   Cardiovascular:  Negative for chest pain.  Gastrointestinal:  Negative for abdominal pain.  Psychiatric/Behavioral:  Negative for behavioral problems and confusion.    Vital Signs: BP (!) 148/74 (BP Location: Right Arm)   Pulse 75   Temp 97.9 F (36.6 C) (Oral)   Resp 18   Ht 5\' 6"  (1.676 m)   Wt 140 lb (63.5 kg)   SpO2 99%   BMI 22.60 kg/m   Physical Exam Vitals reviewed.  HENT:     Mouth/Throat:     Mouth: Mucous membranes are moist.  Cardiovascular:     Rate and Rhythm: Normal rate and regular rhythm.     Heart sounds: Normal heart sounds.  Pulmonary:     Effort: Pulmonary effort is normal.     Breath sounds: Normal breath sounds.  Abdominal:     Palpations: Abdomen is soft.  Musculoskeletal:        General: Normal range of motion.  Skin:    General: Skin is warm.     Comments: Foley catheter in place  Neurological:     Mental Status: He is alert and oriented to person, place, and time.  Psychiatric:        Behavior: Behavior normal.    Imaging: No results found.  Labs:  CBC: Recent Labs    04/04/21 0911  WBC 9.0  HGB 12.0*  HCT 39.0  PLT 382    COAGS: Recent Labs    04/04/21 0911  INR 1.2    BMP: No results for input(s): NA, K, CL, CO2, GLUCOSE, BUN, CALCIUM, CREATININE, GFRNONAA, GFRAA in the last 8760 hours.  Invalid input(s): CMP  LIVER FUNCTION TESTS: No results for input(s): BILITOT, AST, ALT, ALKPHOS, PROT, ALBUMIN in the last 8760 hours.  TUMOR MARKERS: No results for input(s): AFPTM, CEA, CA199, CHROMGRNA in the last 8760 hours.  Assessment and Plan:  CVA 2021 Known BPH- post TURP 2014 Foley cath since 2014 Areflexic bladder per Urology Need for supra pubic catheter placement Pt is aware of procedure benefits and risks including but not limioted to Infection; bleeding; damage to surrounding structures Agreeable to proceed Consent signed and in chart   Thank  you for this interesting consult.  I greatly enjoyed meeting Glenn Mendez and look forward to participating in their care.  A copy of this report was sent to the requesting provider on this date.  Electronically Signed: Gilford Silvius, PA-C 04/04/2021, 10:33 AM   I spent a total of  30 Minutes   in face to face in clinical consultation, greater than 50% of which was counseling/coordinating care for supra pubic catheter placement

## 2021-04-06 DIAGNOSIS — I69354 Hemiplegia and hemiparesis following cerebral infarction affecting left non-dominant side: Secondary | ICD-10-CM | POA: Diagnosis not present

## 2021-04-06 DIAGNOSIS — R2689 Other abnormalities of gait and mobility: Secondary | ICD-10-CM | POA: Diagnosis not present

## 2021-04-07 DIAGNOSIS — R2689 Other abnormalities of gait and mobility: Secondary | ICD-10-CM | POA: Diagnosis not present

## 2021-04-07 DIAGNOSIS — I69354 Hemiplegia and hemiparesis following cerebral infarction affecting left non-dominant side: Secondary | ICD-10-CM | POA: Diagnosis not present

## 2021-04-11 DIAGNOSIS — R2689 Other abnormalities of gait and mobility: Secondary | ICD-10-CM | POA: Diagnosis not present

## 2021-04-12 DIAGNOSIS — R2689 Other abnormalities of gait and mobility: Secondary | ICD-10-CM | POA: Diagnosis not present

## 2021-04-13 DIAGNOSIS — R2689 Other abnormalities of gait and mobility: Secondary | ICD-10-CM | POA: Diagnosis not present

## 2021-04-14 DIAGNOSIS — R2689 Other abnormalities of gait and mobility: Secondary | ICD-10-CM | POA: Diagnosis not present

## 2021-04-17 DIAGNOSIS — R2689 Other abnormalities of gait and mobility: Secondary | ICD-10-CM | POA: Diagnosis not present

## 2021-04-18 ENCOUNTER — Other Ambulatory Visit (HOSPITAL_COMMUNITY): Payer: Self-pay | Admitting: Urology

## 2021-04-18 DIAGNOSIS — R2689 Other abnormalities of gait and mobility: Secondary | ICD-10-CM | POA: Diagnosis not present

## 2021-04-18 DIAGNOSIS — I6389 Other cerebral infarction: Secondary | ICD-10-CM

## 2021-04-19 DIAGNOSIS — R2689 Other abnormalities of gait and mobility: Secondary | ICD-10-CM | POA: Diagnosis not present

## 2021-04-20 DIAGNOSIS — R2689 Other abnormalities of gait and mobility: Secondary | ICD-10-CM | POA: Diagnosis not present

## 2021-04-22 DIAGNOSIS — R2689 Other abnormalities of gait and mobility: Secondary | ICD-10-CM | POA: Diagnosis not present

## 2021-04-25 DIAGNOSIS — R2689 Other abnormalities of gait and mobility: Secondary | ICD-10-CM | POA: Diagnosis not present

## 2021-04-26 DIAGNOSIS — R2689 Other abnormalities of gait and mobility: Secondary | ICD-10-CM | POA: Diagnosis not present

## 2021-04-27 DIAGNOSIS — R2689 Other abnormalities of gait and mobility: Secondary | ICD-10-CM | POA: Diagnosis not present

## 2021-04-28 DIAGNOSIS — R2689 Other abnormalities of gait and mobility: Secondary | ICD-10-CM | POA: Diagnosis not present

## 2021-04-29 DIAGNOSIS — R2689 Other abnormalities of gait and mobility: Secondary | ICD-10-CM | POA: Diagnosis not present

## 2021-05-02 DIAGNOSIS — R2689 Other abnormalities of gait and mobility: Secondary | ICD-10-CM | POA: Diagnosis not present

## 2021-05-02 DIAGNOSIS — E0591 Thyrotoxicosis, unspecified with thyrotoxic crisis or storm: Secondary | ICD-10-CM | POA: Diagnosis not present

## 2021-05-02 DIAGNOSIS — D649 Anemia, unspecified: Secondary | ICD-10-CM | POA: Diagnosis not present

## 2021-05-02 DIAGNOSIS — E785 Hyperlipidemia, unspecified: Secondary | ICD-10-CM | POA: Diagnosis not present

## 2021-05-03 DIAGNOSIS — R2689 Other abnormalities of gait and mobility: Secondary | ICD-10-CM | POA: Diagnosis not present

## 2021-05-04 DIAGNOSIS — R2689 Other abnormalities of gait and mobility: Secondary | ICD-10-CM | POA: Diagnosis not present

## 2021-05-05 DIAGNOSIS — R2689 Other abnormalities of gait and mobility: Secondary | ICD-10-CM | POA: Diagnosis not present

## 2021-05-27 ENCOUNTER — Other Ambulatory Visit: Payer: Self-pay | Admitting: Physician Assistant

## 2021-05-30 ENCOUNTER — Other Ambulatory Visit: Payer: Self-pay

## 2021-05-30 ENCOUNTER — Encounter (HOSPITAL_COMMUNITY): Payer: Self-pay

## 2021-05-30 ENCOUNTER — Ambulatory Visit (HOSPITAL_COMMUNITY)
Admission: RE | Admit: 2021-05-30 | Discharge: 2021-05-30 | Disposition: A | Discharge status: Home / Self Care | Payer: Medicare Other | Source: Ambulatory Visit | Attending: Urology | Admitting: Urology

## 2021-05-30 DIAGNOSIS — T83090A Other mechanical complication of cystostomy catheter, initial encounter: Secondary | ICD-10-CM | POA: Diagnosis not present

## 2021-05-30 DIAGNOSIS — Z435 Encounter for attention to cystostomy: Secondary | ICD-10-CM | POA: Diagnosis not present

## 2021-05-30 DIAGNOSIS — Z7982 Long term (current) use of aspirin: Secondary | ICD-10-CM | POA: Insufficient documentation

## 2021-05-30 DIAGNOSIS — Z79899 Other long term (current) drug therapy: Secondary | ICD-10-CM | POA: Diagnosis not present

## 2021-05-30 DIAGNOSIS — I6389 Other cerebral infarction: Secondary | ICD-10-CM | POA: Diagnosis not present

## 2021-05-30 HISTORY — PX: IR CATHETER TUBE CHANGE: IMG717

## 2021-05-30 IMAGING — XA IR CATHETER TUBE CHANGE
3 series · 9 of 9 positions shown · non-contrast
Comparison: [DATE]

INDICATION: 88-year-old male with history of stroke and areflexic bladder status
post suprapubic catheter placement on [DATE].

EXAM:
FLUOROSCOPIC GUIDED SUPRAPUBIC CATHETER EXCHANGE

[Series 1: fl (-) angio · 4 of 14 frames shown (1 of 2)]
[frame 2/14]
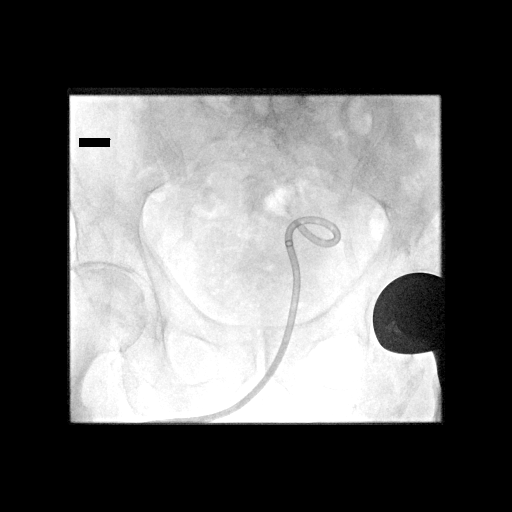
[frame 3/14]
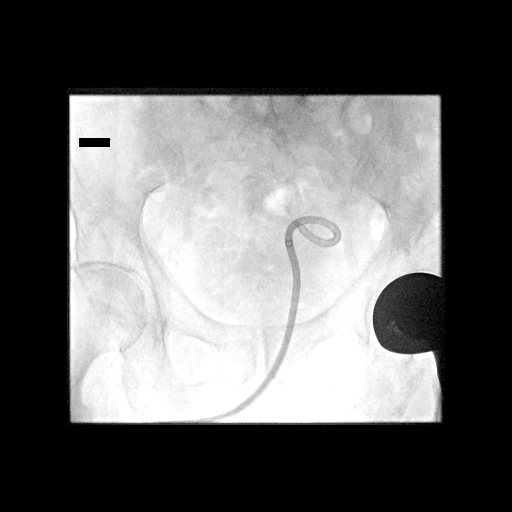
[frame 8/14]
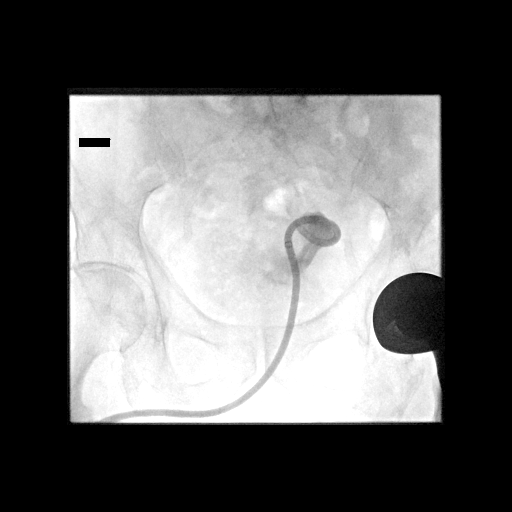
[frame 12/14]
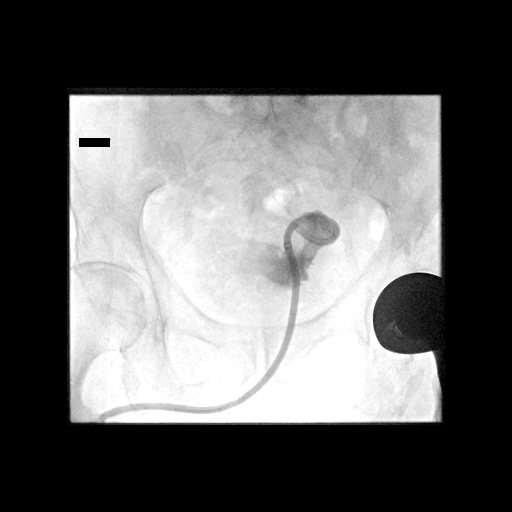

[Series 2: fl (-) angio · 4 of 15 frames shown (2 of 2)]
[frame 3/15]
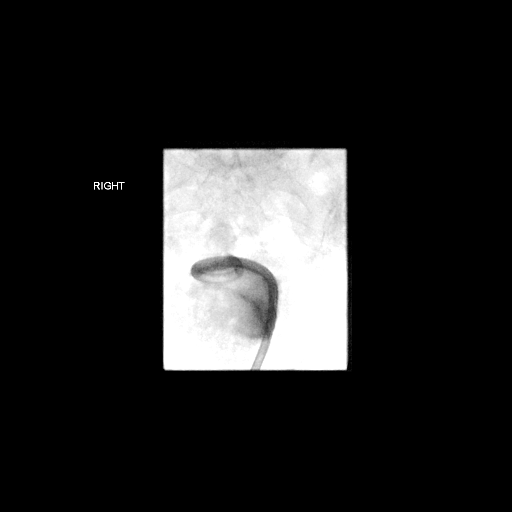
[frame 5/15]
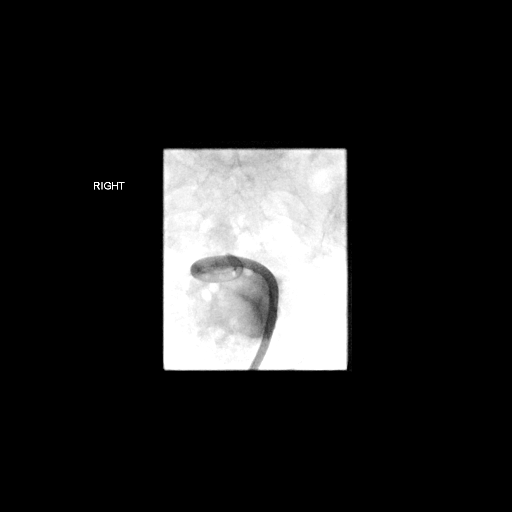
[frame 8/15]
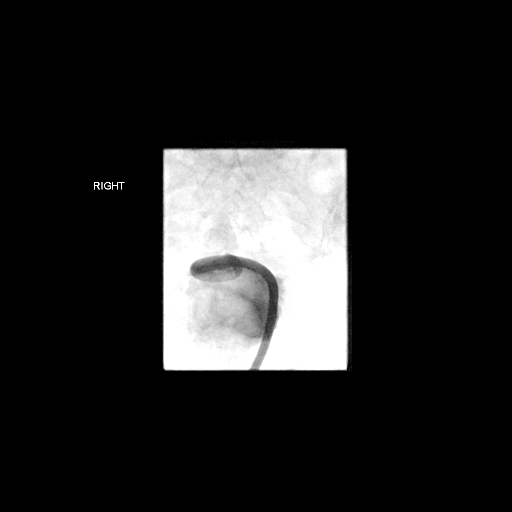
[frame 13/15]
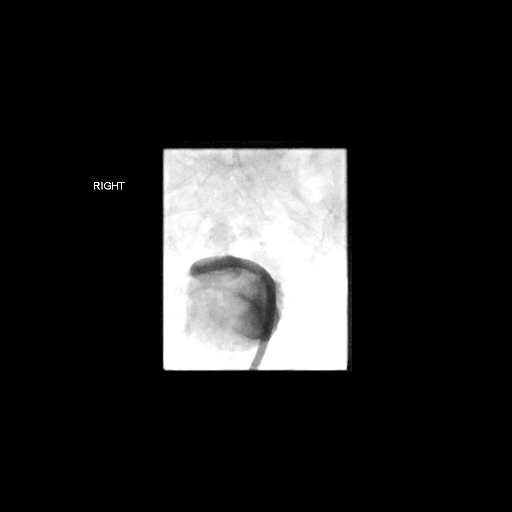

[Series 3: single · 1 of 1 slices shown]
[im 1/1]
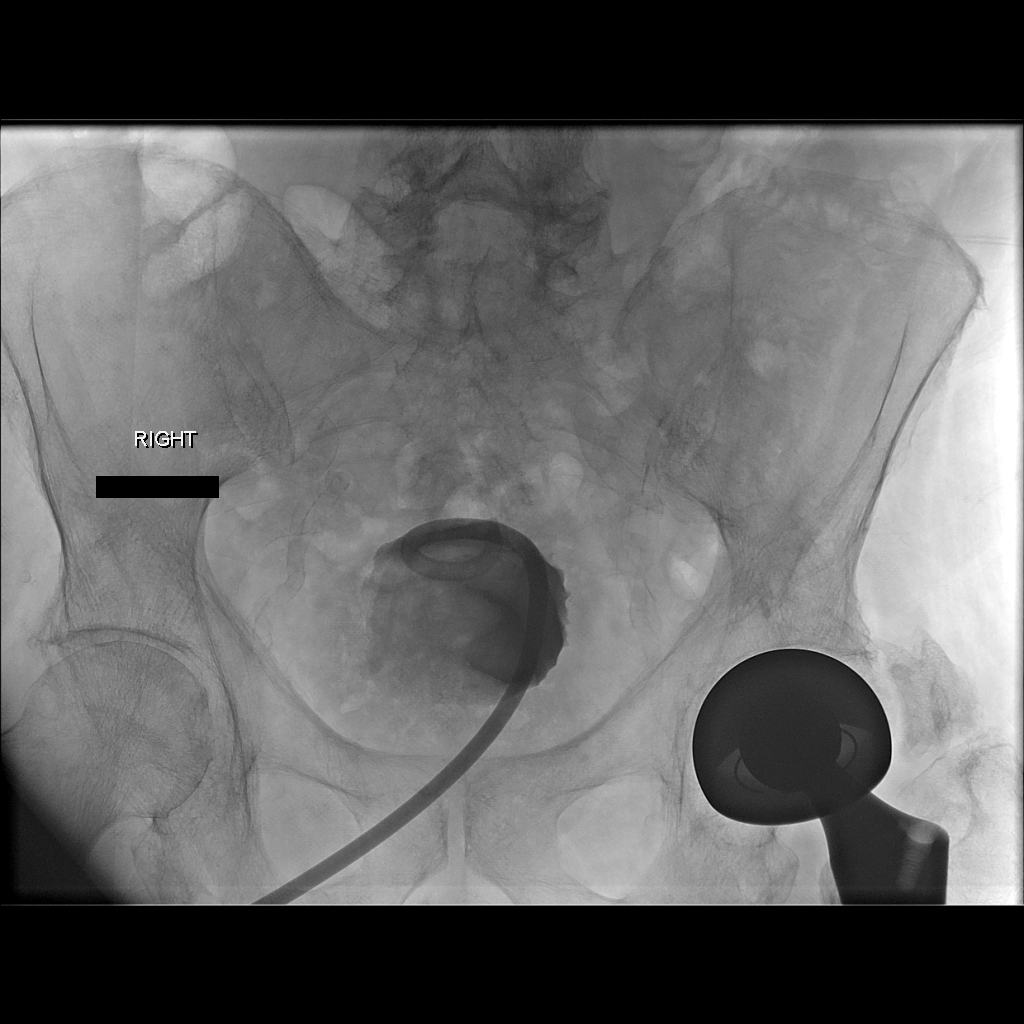

[9 of 9 positions shown; findings below may reference images not displayed]

MEDICATIONS:
None

ANESTHESIA/SEDATION:
Moderate (conscious) sedation was employed during this procedure. A
total of Versed 1 mg and Fentanyl 50 mcg was administered
intravenously.

Moderate Sedation Time: 10 minutes. The patient's level of
consciousness and vital signs were monitored continuously by
radiology nursing throughout the procedure under my direct
supervision.

CONTRAST:  10 mL Omnipaque 240, administered into the urinary
bladder

FLUOROSCOPY TIME:  24 seconds, 3 mGy

COMPLICATIONS:
None immediate.

PROCEDURE:
The procedure, risks, benefits, and alternatives were explained to
the patient. Questions regarding the procedure were encouraged and
answered. The patient understands and consents to the procedure. A
timeout was performed prior to the initiation of the procedure.

The external portion of the existing suprapubic catheter as well as
the surrounding skin were prepped and draped in usual sterile
fashion

A preprocedural spot fluoroscopic image was obtained of the lower
pelvis and existing 14 French all-purpose drainage catheter with end
coiled and locked overlying the expected location of the urinary
bladder.

Contrast injection confirmed appropriate positioning and
functionality existing suprapubic catheter.

The external portion of the existing suprapubic catheter was cut and
cannulated with a short Amplatz wire which was coiled within the
urinary bladder.

Under intermittent fluoroscopic guidance, the existing all-purpose
drainage catheter was exchanged for a new 16 Fr multipurpose,
pigtail drainage catheter.

Appropriate positioning was confirmed with the injection of a small
amount of contrast as well as the efflux of urine.

The catheter was connected to a gravity bag. A dressing was placed.
The patient tolerated the procedure well without immediate
postprocedural complication.
FINDINGS: Preprocedural imaging demonstrates unchanged positioning of
all-purpose drainage catheter with end coiled and locked over the
expected location of the urinary bladder.

Contrast injection confirms appropriate positioning and
functionality of the existing percutaneous drainage catheter.

After fluoroscopic guided exchange, a new 16 Fr pigtail catheter is
appropriately positioned within the urinary bladder.
IMPRESSION: Technically successful fluoroscopic guided exchange of 16 Fr pigtail
suprapubic catheter be for the purposes of chronic urinary bladder
decompression.

## 2021-05-30 MED ORDER — FENTANYL CITRATE (PF) 100 MCG/2ML IJ SOLN
INTRAMUSCULAR | Status: DC | PRN
  Administered 2021-05-30: 50 ug via INTRAVENOUS

## 2021-05-30 MED ORDER — SODIUM CHLORIDE 0.9 % IV SOLN: INTRAVENOUS | Status: DC

## 2021-05-30 MED ORDER — SODIUM CHLORIDE 0.9% FLUSH: 5.0000 mL | Freq: Three times a day (TID) | INTRAVENOUS | Status: DC

## 2021-05-30 MED ORDER — MIDAZOLAM HCL 2 MG/2ML IJ SOLN
INTRAMUSCULAR | Status: AC
  Filled 2021-05-30: qty 2

## 2021-05-30 MED ORDER — LIDOCAINE HCL 1 % IJ SOLN
INTRAMUSCULAR | Status: AC
  Filled 2021-05-30: qty 20

## 2021-05-30 MED ORDER — LIDOCAINE HCL 1 % IJ SOLN
INTRAMUSCULAR | Status: DC | PRN
  Administered 2021-05-30: 5 mL via INTRADERMAL

## 2021-05-30 MED ORDER — IOHEXOL 240 MG/ML SOLN
50.0000 mL | Freq: Once | INTRAMUSCULAR | Status: AC | PRN
  Administered 2021-05-30: 10 mL

## 2021-05-30 MED ORDER — MIDAZOLAM HCL 2 MG/2ML IJ SOLN
INTRAMUSCULAR | Status: DC | PRN
  Administered 2021-05-30: 1 mg via INTRAVENOUS

## 2021-05-30 MED ORDER — FENTANYL CITRATE (PF) 100 MCG/2ML IJ SOLN
INTRAMUSCULAR | Status: AC
  Filled 2021-05-30: qty 2

## 2021-05-30 NOTE — H&P (Signed)
Chief Complaint Patient was seen in consultation today for upsize suprapubic catheter at the request of Glenn Mendez  Referring Physician(s): Glenn Mendez  Supervising Physician: Glenn Mendez  Patient Status: [facility] - Out-pt  History of Present Illness: Glenn Mendez is a 87 y.o. male   BPH; post TURP Foley cath since 2014 CVA 2021 Areflexic bladder  Was seen in IR 04/04/21 for suprapubic catheter placement IMPRESSION: Successful CT-guided placement of 14 French pigtail suprapubic catheter.   Tolerating well  Scheduled today for upsize of supra pubic catheter per Glenn Glenn Mendez- Urologist      Past Medical History:  Diagnosis Date   BPH (benign prostatic hyperplasia)    Duodenal ulcer with hemorrhage 1957   Foley catheter in place 04/17/13   History of blood transfusion    Renal failure    Sigmoid diverticulosis     Past Surgical History:  Procedure Laterality Date   COLONOSCOPY W/ POLYPECTOMY     HERNIA REPAIR     INSERTION OF SUPRAPUBIC CATHETER N/A 04/23/2013   Procedure: INSERTION OF SUPRAPUBIC CATHETER;  Surgeon: Valetta Fuller, MD;  Location: [facility];  Service: Urology;  Laterality: N/A;   TONSILLECTOMY     TRANSURETHRAL RESECTION OF PROSTATE N/A 04/23/2013   Procedure: TRANSURETHRAL RESECTION OF THE PROSTATE WITH GYRUS INSTRUMENTS;  Surgeon: Valetta Fuller, MD;  Location: [facility];  Service: Urology;  Laterality: N/A;    Allergies: Cat hair extract  Medications: Prior to Admission medications   Medication Sig Start Date End Date Taking? Authorizing Provider  aspirin 81 MG chewable tablet Chew by mouth daily.   Yes [provider]  atorvastatin (LIPITOR) 40 MG tablet Take 40 mg by mouth at bedtime.   Yes [provider]  Cyanocobalamin (B-12 PO) Take 2 tablets by mouth daily.   Yes [provider]  ferrous sulfate 325 (65 FE) MG tablet Take 325 mg by mouth daily.   Yes [provider]   levothyroxine (SYNTHROID) 75 MCG tablet Take 37.5 mcg by mouth daily before breakfast.   Yes [provider]  melatonin 3 MG TABS tablet Take 3 mg by mouth at bedtime.   Yes [provider]  Multiple Vitamin (MULTIVITAMIN WITH MINERALS) TABS tablet Take 1 tablet by mouth daily.   Yes [provider]  Probiotic Product (PHILLIPS COLON HEALTH) CAPS Take 1 capsule by mouth daily.   Yes [provider]  Skin Protectants, Misc. (BAZA PROTECT EX) Apply 1 application topically 3 (three) times daily.   Yes [provider]  tamsulosin (FLOMAX) 0.4 MG CAPS capsule Take 0.4 mg by mouth daily.   Yes [provider]  Vitamin D, Ergocalciferol, (DRISDOL) 1.25 MG (50000 UNIT) CAPS capsule Take 50,000 Units by mouth every 30 (thirty) days. 17th of every month   Yes [provider]     Family History  Problem Relation Age of Onset   Stroke Father     Social History   Socioeconomic History   Marital status: Married    Spouse name: Not on file   Number of children: 3   Years of education: Not on file   Highest education level: Not on file  Occupational History   Occupation: Retired  Tobacco Use   Smoking status: Never   Smokeless tobacco: Never  Substance and Sexual Activity   Alcohol use: Yes    Alcohol/week: 32.0 standard drinks    Types: 32 Shots of liquor per week    Comment: 4 ounces burbon  3-4 x week and has done this for years.   Drug use: No   Sexual activity: Not on file  Other Topics Concern   Not on file  Social History Narrative   Not on file   Social Determinants of Health   Financial Resource Strain: Not on file  Food Insecurity: Not on file  Transportation Needs: Not on file  Physical Activity: Not on file  Stress: Not on file  Social Connections: Not on file    Review of Systems: A 12 point ROS discussed and pertinent positives are indicated in the HPI above.  All other systems are negative.    Vital  Signs: BP 116/70   Pulse 74   Temp 98.3 F (36.8 C) (Oral)   Resp 18   Ht 5\' 6"  (1.676 m)   Wt 140 lb (63.5 kg)   SpO2 97%   BMI 22.60 kg/m   Physical Exam Vitals reviewed.  HENT:     Mouth/Throat:     Mouth: Mucous membranes are moist.  Cardiovascular:     Rate and Rhythm: Normal rate and regular rhythm.     Heart sounds: Normal heart sounds.  Pulmonary:     Breath sounds: Normal breath sounds.  Abdominal:     Palpations: Abdomen is soft.  Musculoskeletal:        General: Normal range of motion.  Skin:    General: Skin is warm.  Neurological:     Mental Status: He is alert and oriented to person, place, and time.  Psychiatric:        Behavior: Behavior normal.    Imaging: No results found.  Labs:  CBC: Recent Labs    04/04/21 0911  WBC 9.0  HGB 12.0*  HCT 39.0  PLT 382    COAGS: Recent Labs    04/04/21 0911  INR 1.2    BMP: No results for input(s): NA, K, CL, CO2, GLUCOSE, BUN, CALCIUM, CREATININE, GFRNONAA, GFRAA in the last 8760 hours.  Invalid input(s): CMP  LIVER FUNCTION TESTS: No results for input(s): BILITOT, AST, ALT, ALKPHOS, PROT, ALBUMIN in the last 8760 hours.  TUMOR MARKERS: No results for input(s): AFPTM, CEA, CA199, CHROMGRNA in the last 8760 hours.  Assessment and Plan:  CVA Areflexic bladder Hx chronic indwelling foley catheter 14 Fr suprapubic catheter placed in IR 04/04/21 Scheduled today for upsize of same per Urology  Pt is aware of procedure benefits and risks Including but limited to Infection; bleeding; damage to surrounding organs He is agreeable to proceed Consent signed and in chart  Thank you for this interesting consult.  I greatly enjoyed meeting Glenn Mendez and look forward to participating in their care.  A copy of this report was sent to the requesting provider on this date.  Electronically Signed: Gilford Silvius, PA-C 05/30/2021, 10:59 AM   I spent a total of    25 Minutes in face to face in  clinical consultation, greater than 50% of which was counseling/coordinating care for upsize suprapubic catheter

## 2021-05-30 NOTE — Procedures (Signed)
Interventional Radiology Procedure Note  Procedure: Suprapubic catheter exchange  Findings: Please refer to procedural dictation for full description. Upsized from 14 Fr to 16 Fr.  Complications: None immediate  Estimated Blood Loss: < 5 mL  Recommendations: Keep to bag drainage. Further management as per Urology.   Marliss Coots, MD Pager: [phone]

## 2021-06-27 DIAGNOSIS — R269 Unspecified abnormalities of gait and mobility: Secondary | ICD-10-CM | POA: Diagnosis not present

## 2021-06-27 DIAGNOSIS — I69359 Hemiplegia and hemiparesis following cerebral infarction affecting unspecified side: Secondary | ICD-10-CM | POA: Diagnosis not present

## 2021-06-27 DIAGNOSIS — Z9359 Other cystostomy status: Secondary | ICD-10-CM | POA: Diagnosis not present

## 2021-06-27 DIAGNOSIS — I679 Cerebrovascular disease, unspecified: Secondary | ICD-10-CM | POA: Diagnosis not present

## 2021-06-27 DIAGNOSIS — I639 Cerebral infarction, unspecified: Secondary | ICD-10-CM | POA: Diagnosis not present

## 2021-06-27 DIAGNOSIS — L853 Xerosis cutis: Secondary | ICD-10-CM | POA: Diagnosis not present

## 2021-06-27 DIAGNOSIS — N139 Obstructive and reflux uropathy, unspecified: Secondary | ICD-10-CM | POA: Diagnosis not present

## 2021-06-27 DIAGNOSIS — E039 Hypothyroidism, unspecified: Secondary | ICD-10-CM | POA: Diagnosis not present

## 2021-07-09 DIAGNOSIS — Z79899 Other long term (current) drug therapy: Secondary | ICD-10-CM | POA: Diagnosis not present

## 2021-07-09 DIAGNOSIS — R310 Gross hematuria: Secondary | ICD-10-CM | POA: Diagnosis not present

## 2021-07-09 DIAGNOSIS — K5790 Diverticulosis of intestine, part unspecified, without perforation or abscess without bleeding: Secondary | ICD-10-CM | POA: Diagnosis not present

## 2021-07-09 DIAGNOSIS — N281 Cyst of kidney, acquired: Secondary | ICD-10-CM | POA: Diagnosis not present

## 2021-07-09 DIAGNOSIS — I69354 Hemiplegia and hemiparesis following cerebral infarction affecting left non-dominant side: Secondary | ICD-10-CM | POA: Diagnosis not present

## 2021-07-09 DIAGNOSIS — D649 Anemia, unspecified: Secondary | ICD-10-CM | POA: Diagnosis not present

## 2021-07-09 DIAGNOSIS — T83010A Breakdown (mechanical) of cystostomy catheter, initial encounter: Secondary | ICD-10-CM | POA: Diagnosis not present

## 2021-07-09 DIAGNOSIS — Z8673 Personal history of transient ischemic attack (TIA), and cerebral infarction without residual deficits: Secondary | ICD-10-CM | POA: Diagnosis not present

## 2021-07-09 DIAGNOSIS — Z7982 Long term (current) use of aspirin: Secondary | ICD-10-CM | POA: Diagnosis not present

## 2021-07-09 DIAGNOSIS — R918 Other nonspecific abnormal finding of lung field: Secondary | ICD-10-CM | POA: Diagnosis not present

## 2021-07-09 DIAGNOSIS — R319 Hematuria, unspecified: Secondary | ICD-10-CM | POA: Diagnosis not present

## 2021-07-09 DIAGNOSIS — T83098A Other mechanical complication of other indwelling urethral catheter, initial encounter: Secondary | ICD-10-CM | POA: Diagnosis not present

## 2021-07-09 DIAGNOSIS — N323 Diverticulum of bladder: Secondary | ICD-10-CM | POA: Diagnosis not present

## 2021-07-09 DIAGNOSIS — K449 Diaphragmatic hernia without obstruction or gangrene: Secondary | ICD-10-CM | POA: Diagnosis not present

## 2021-07-09 DIAGNOSIS — N200 Calculus of kidney: Secondary | ICD-10-CM | POA: Diagnosis not present

## 2021-07-10 DIAGNOSIS — K449 Diaphragmatic hernia without obstruction or gangrene: Secondary | ICD-10-CM | POA: Diagnosis not present

## 2021-07-10 DIAGNOSIS — R109 Unspecified abdominal pain: Secondary | ICD-10-CM | POA: Diagnosis not present

## 2021-07-10 DIAGNOSIS — T83098A Other mechanical complication of other indwelling urethral catheter, initial encounter: Secondary | ICD-10-CM | POA: Diagnosis not present

## 2021-07-10 DIAGNOSIS — R310 Gross hematuria: Secondary | ICD-10-CM | POA: Diagnosis not present

## 2021-07-10 DIAGNOSIS — Z8673 Personal history of transient ischemic attack (TIA), and cerebral infarction without residual deficits: Secondary | ICD-10-CM | POA: Diagnosis not present

## 2021-07-10 DIAGNOSIS — T83010A Breakdown (mechanical) of cystostomy catheter, initial encounter: Secondary | ICD-10-CM | POA: Diagnosis not present

## 2021-07-10 DIAGNOSIS — N200 Calculus of kidney: Secondary | ICD-10-CM | POA: Diagnosis not present

## 2021-07-10 DIAGNOSIS — N323 Diverticulum of bladder: Secondary | ICD-10-CM | POA: Diagnosis not present

## 2021-07-10 DIAGNOSIS — K5790 Diverticulosis of intestine, part unspecified, without perforation or abscess without bleeding: Secondary | ICD-10-CM | POA: Diagnosis not present

## 2021-07-11 DIAGNOSIS — Z466 Encounter for fitting and adjustment of urinary device: Secondary | ICD-10-CM | POA: Diagnosis not present

## 2021-07-11 DIAGNOSIS — R319 Hematuria, unspecified: Secondary | ICD-10-CM | POA: Diagnosis not present

## 2021-07-12 DIAGNOSIS — R319 Hematuria, unspecified: Secondary | ICD-10-CM | POA: Diagnosis not present

## 2021-07-13 DIAGNOSIS — D649 Anemia, unspecified: Secondary | ICD-10-CM | POA: Diagnosis not present

## 2021-07-13 DIAGNOSIS — R319 Hematuria, unspecified: Secondary | ICD-10-CM | POA: Diagnosis not present

## 2021-07-13 DIAGNOSIS — N139 Obstructive and reflux uropathy, unspecified: Secondary | ICD-10-CM | POA: Diagnosis not present

## 2021-07-13 DIAGNOSIS — R2689 Other abnormalities of gait and mobility: Secondary | ICD-10-CM | POA: Diagnosis not present

## 2021-07-13 DIAGNOSIS — I69354 Hemiplegia and hemiparesis following cerebral infarction affecting left non-dominant side: Secondary | ICD-10-CM | POA: Diagnosis not present

## 2021-07-13 DIAGNOSIS — R278 Other lack of coordination: Secondary | ICD-10-CM | POA: Diagnosis not present

## 2021-07-15 DIAGNOSIS — R339 Retention of urine, unspecified: Secondary | ICD-10-CM | POA: Diagnosis not present

## 2021-07-19 DIAGNOSIS — R310 Gross hematuria: Secondary | ICD-10-CM | POA: Diagnosis not present

## 2021-07-19 DIAGNOSIS — R339 Retention of urine, unspecified: Secondary | ICD-10-CM | POA: Diagnosis not present

## 2021-08-06 DIAGNOSIS — R278 Other lack of coordination: Secondary | ICD-10-CM | POA: Diagnosis not present

## 2021-08-06 DIAGNOSIS — I69354 Hemiplegia and hemiparesis following cerebral infarction affecting left non-dominant side: Secondary | ICD-10-CM | POA: Diagnosis not present

## 2021-08-06 DIAGNOSIS — R2689 Other abnormalities of gait and mobility: Secondary | ICD-10-CM | POA: Diagnosis not present

## 2021-08-08 DIAGNOSIS — R2689 Other abnormalities of gait and mobility: Secondary | ICD-10-CM | POA: Diagnosis not present

## 2021-08-08 DIAGNOSIS — I69354 Hemiplegia and hemiparesis following cerebral infarction affecting left non-dominant side: Secondary | ICD-10-CM | POA: Diagnosis not present

## 2021-08-08 DIAGNOSIS — R278 Other lack of coordination: Secondary | ICD-10-CM | POA: Diagnosis not present

## 2021-08-09 DIAGNOSIS — I69354 Hemiplegia and hemiparesis following cerebral infarction affecting left non-dominant side: Secondary | ICD-10-CM | POA: Diagnosis not present

## 2021-08-09 DIAGNOSIS — R278 Other lack of coordination: Secondary | ICD-10-CM | POA: Diagnosis not present

## 2021-08-09 DIAGNOSIS — R2689 Other abnormalities of gait and mobility: Secondary | ICD-10-CM | POA: Diagnosis not present

## 2021-08-10 DIAGNOSIS — I69354 Hemiplegia and hemiparesis following cerebral infarction affecting left non-dominant side: Secondary | ICD-10-CM | POA: Diagnosis not present

## 2021-08-10 DIAGNOSIS — R278 Other lack of coordination: Secondary | ICD-10-CM | POA: Diagnosis not present

## 2021-08-10 DIAGNOSIS — R2689 Other abnormalities of gait and mobility: Secondary | ICD-10-CM | POA: Diagnosis not present

## 2021-08-11 DIAGNOSIS — R2689 Other abnormalities of gait and mobility: Secondary | ICD-10-CM | POA: Diagnosis not present

## 2021-08-11 DIAGNOSIS — R278 Other lack of coordination: Secondary | ICD-10-CM | POA: Diagnosis not present

## 2021-08-11 DIAGNOSIS — I69354 Hemiplegia and hemiparesis following cerebral infarction affecting left non-dominant side: Secondary | ICD-10-CM | POA: Diagnosis not present

## 2021-08-12 DIAGNOSIS — R339 Retention of urine, unspecified: Secondary | ICD-10-CM | POA: Diagnosis not present

## 2021-08-15 DIAGNOSIS — I69354 Hemiplegia and hemiparesis following cerebral infarction affecting left non-dominant side: Secondary | ICD-10-CM | POA: Diagnosis not present

## 2021-08-15 DIAGNOSIS — R2689 Other abnormalities of gait and mobility: Secondary | ICD-10-CM | POA: Diagnosis not present

## 2021-08-15 DIAGNOSIS — R278 Other lack of coordination: Secondary | ICD-10-CM | POA: Diagnosis not present

## 2021-08-16 DIAGNOSIS — I69354 Hemiplegia and hemiparesis following cerebral infarction affecting left non-dominant side: Secondary | ICD-10-CM | POA: Diagnosis not present

## 2021-08-16 DIAGNOSIS — R2689 Other abnormalities of gait and mobility: Secondary | ICD-10-CM | POA: Diagnosis not present

## 2021-08-16 DIAGNOSIS — R278 Other lack of coordination: Secondary | ICD-10-CM | POA: Diagnosis not present

## 2021-08-17 DIAGNOSIS — I69354 Hemiplegia and hemiparesis following cerebral infarction affecting left non-dominant side: Secondary | ICD-10-CM | POA: Diagnosis not present

## 2021-08-17 DIAGNOSIS — R2689 Other abnormalities of gait and mobility: Secondary | ICD-10-CM | POA: Diagnosis not present

## 2021-08-17 DIAGNOSIS — E039 Hypothyroidism, unspecified: Secondary | ICD-10-CM | POA: Diagnosis not present

## 2021-08-17 DIAGNOSIS — D638 Anemia in other chronic diseases classified elsewhere: Secondary | ICD-10-CM | POA: Diagnosis not present

## 2021-08-17 DIAGNOSIS — R278 Other lack of coordination: Secondary | ICD-10-CM | POA: Diagnosis not present

## 2021-08-17 DIAGNOSIS — E559 Vitamin D deficiency, unspecified: Secondary | ICD-10-CM | POA: Diagnosis not present

## 2021-08-17 DIAGNOSIS — Z9359 Other cystostomy status: Secondary | ICD-10-CM | POA: Diagnosis not present

## 2021-08-17 DIAGNOSIS — I679 Cerebrovascular disease, unspecified: Secondary | ICD-10-CM | POA: Diagnosis not present

## 2021-08-18 DIAGNOSIS — I69354 Hemiplegia and hemiparesis following cerebral infarction affecting left non-dominant side: Secondary | ICD-10-CM | POA: Diagnosis not present

## 2021-08-18 DIAGNOSIS — R2689 Other abnormalities of gait and mobility: Secondary | ICD-10-CM | POA: Diagnosis not present

## 2021-08-18 DIAGNOSIS — R278 Other lack of coordination: Secondary | ICD-10-CM | POA: Diagnosis not present

## 2021-08-22 DIAGNOSIS — I69354 Hemiplegia and hemiparesis following cerebral infarction affecting left non-dominant side: Secondary | ICD-10-CM | POA: Diagnosis not present

## 2021-08-22 DIAGNOSIS — R2689 Other abnormalities of gait and mobility: Secondary | ICD-10-CM | POA: Diagnosis not present

## 2021-08-22 DIAGNOSIS — R278 Other lack of coordination: Secondary | ICD-10-CM | POA: Diagnosis not present

## 2021-08-23 DIAGNOSIS — I69354 Hemiplegia and hemiparesis following cerebral infarction affecting left non-dominant side: Secondary | ICD-10-CM | POA: Diagnosis not present

## 2021-08-23 DIAGNOSIS — R2689 Other abnormalities of gait and mobility: Secondary | ICD-10-CM | POA: Diagnosis not present

## 2021-08-23 DIAGNOSIS — E039 Hypothyroidism, unspecified: Secondary | ICD-10-CM | POA: Diagnosis not present

## 2021-08-23 DIAGNOSIS — R278 Other lack of coordination: Secondary | ICD-10-CM | POA: Diagnosis not present

## 2021-08-24 DIAGNOSIS — I69354 Hemiplegia and hemiparesis following cerebral infarction affecting left non-dominant side: Secondary | ICD-10-CM | POA: Diagnosis not present

## 2021-08-24 DIAGNOSIS — R278 Other lack of coordination: Secondary | ICD-10-CM | POA: Diagnosis not present

## 2021-08-24 DIAGNOSIS — R2689 Other abnormalities of gait and mobility: Secondary | ICD-10-CM | POA: Diagnosis not present

## 2021-08-25 DIAGNOSIS — R278 Other lack of coordination: Secondary | ICD-10-CM | POA: Diagnosis not present

## 2021-08-25 DIAGNOSIS — R2689 Other abnormalities of gait and mobility: Secondary | ICD-10-CM | POA: Diagnosis not present

## 2021-08-25 DIAGNOSIS — I69354 Hemiplegia and hemiparesis following cerebral infarction affecting left non-dominant side: Secondary | ICD-10-CM | POA: Diagnosis not present

## 2021-08-26 DIAGNOSIS — R278 Other lack of coordination: Secondary | ICD-10-CM | POA: Diagnosis not present

## 2021-08-26 DIAGNOSIS — I69354 Hemiplegia and hemiparesis following cerebral infarction affecting left non-dominant side: Secondary | ICD-10-CM | POA: Diagnosis not present

## 2021-08-26 DIAGNOSIS — R2689 Other abnormalities of gait and mobility: Secondary | ICD-10-CM | POA: Diagnosis not present

## 2021-08-30 DIAGNOSIS — R2689 Other abnormalities of gait and mobility: Secondary | ICD-10-CM | POA: Diagnosis not present

## 2021-08-30 DIAGNOSIS — R278 Other lack of coordination: Secondary | ICD-10-CM | POA: Diagnosis not present

## 2021-08-30 DIAGNOSIS — I69354 Hemiplegia and hemiparesis following cerebral infarction affecting left non-dominant side: Secondary | ICD-10-CM | POA: Diagnosis not present

## 2021-08-31 DIAGNOSIS — R278 Other lack of coordination: Secondary | ICD-10-CM | POA: Diagnosis not present

## 2021-08-31 DIAGNOSIS — R2689 Other abnormalities of gait and mobility: Secondary | ICD-10-CM | POA: Diagnosis not present

## 2021-08-31 DIAGNOSIS — I69354 Hemiplegia and hemiparesis following cerebral infarction affecting left non-dominant side: Secondary | ICD-10-CM | POA: Diagnosis not present

## 2021-09-02 DIAGNOSIS — R278 Other lack of coordination: Secondary | ICD-10-CM | POA: Diagnosis not present

## 2021-09-02 DIAGNOSIS — R2689 Other abnormalities of gait and mobility: Secondary | ICD-10-CM | POA: Diagnosis not present

## 2021-09-02 DIAGNOSIS — I69354 Hemiplegia and hemiparesis following cerebral infarction affecting left non-dominant side: Secondary | ICD-10-CM | POA: Diagnosis not present

## 2021-09-09 DIAGNOSIS — R339 Retention of urine, unspecified: Secondary | ICD-10-CM | POA: Diagnosis not present

## 2021-09-14 DIAGNOSIS — D649 Anemia, unspecified: Secondary | ICD-10-CM | POA: Diagnosis not present

## 2021-09-14 DIAGNOSIS — E039 Hypothyroidism, unspecified: Secondary | ICD-10-CM | POA: Diagnosis not present

## 2021-09-14 DIAGNOSIS — I69359 Hemiplegia and hemiparesis following cerebral infarction affecting unspecified side: Secondary | ICD-10-CM | POA: Diagnosis not present

## 2021-09-16 DIAGNOSIS — Z9359 Other cystostomy status: Secondary | ICD-10-CM | POA: Diagnosis not present

## 2021-09-16 DIAGNOSIS — N139 Obstructive and reflux uropathy, unspecified: Secondary | ICD-10-CM | POA: Diagnosis not present

## 2021-09-16 DIAGNOSIS — I10 Essential (primary) hypertension: Secondary | ICD-10-CM | POA: Diagnosis not present

## 2021-09-21 DIAGNOSIS — H1032 Unspecified acute conjunctivitis, left eye: Secondary | ICD-10-CM | POA: Diagnosis not present

## 2021-10-07 DIAGNOSIS — R339 Retention of urine, unspecified: Secondary | ICD-10-CM | POA: Diagnosis not present

## 2021-10-17 DIAGNOSIS — D649 Anemia, unspecified: Secondary | ICD-10-CM | POA: Diagnosis not present

## 2021-10-17 DIAGNOSIS — E039 Hypothyroidism, unspecified: Secondary | ICD-10-CM | POA: Diagnosis not present

## 2021-10-17 DIAGNOSIS — I10 Essential (primary) hypertension: Secondary | ICD-10-CM | POA: Diagnosis not present

## 2021-10-17 DIAGNOSIS — Z9359 Other cystostomy status: Secondary | ICD-10-CM | POA: Diagnosis not present

## 2021-10-17 DIAGNOSIS — N139 Obstructive and reflux uropathy, unspecified: Secondary | ICD-10-CM | POA: Diagnosis not present

## 2021-11-04 DIAGNOSIS — R339 Retention of urine, unspecified: Secondary | ICD-10-CM | POA: Diagnosis not present

## 2021-12-02 DIAGNOSIS — R339 Retention of urine, unspecified: Secondary | ICD-10-CM | POA: Diagnosis not present

## 2021-12-06 DIAGNOSIS — H6120 Impacted cerumen, unspecified ear: Secondary | ICD-10-CM | POA: Diagnosis not present

## 2021-12-06 DIAGNOSIS — I69359 Hemiplegia and hemiparesis following cerebral infarction affecting unspecified side: Secondary | ICD-10-CM | POA: Diagnosis not present

## 2021-12-06 DIAGNOSIS — I10 Essential (primary) hypertension: Secondary | ICD-10-CM | POA: Diagnosis not present

## 2021-12-06 DIAGNOSIS — Z9359 Other cystostomy status: Secondary | ICD-10-CM | POA: Diagnosis not present

## 2021-12-28 DIAGNOSIS — E039 Hypothyroidism, unspecified: Secondary | ICD-10-CM | POA: Diagnosis not present

## 2021-12-28 DIAGNOSIS — I69359 Hemiplegia and hemiparesis following cerebral infarction affecting unspecified side: Secondary | ICD-10-CM | POA: Diagnosis not present

## 2021-12-28 DIAGNOSIS — Z9359 Other cystostomy status: Secondary | ICD-10-CM | POA: Diagnosis not present

## 2021-12-28 DIAGNOSIS — I10 Essential (primary) hypertension: Secondary | ICD-10-CM | POA: Diagnosis not present

## 2021-12-28 DIAGNOSIS — I6782 Cerebral ischemia: Secondary | ICD-10-CM | POA: Diagnosis not present

## 2021-12-28 DIAGNOSIS — D638 Anemia in other chronic diseases classified elsewhere: Secondary | ICD-10-CM | POA: Diagnosis not present

## 2021-12-28 DIAGNOSIS — N139 Obstructive and reflux uropathy, unspecified: Secondary | ICD-10-CM | POA: Diagnosis not present

## 2021-12-29 DIAGNOSIS — R2689 Other abnormalities of gait and mobility: Secondary | ICD-10-CM | POA: Diagnosis not present

## 2021-12-29 DIAGNOSIS — R278 Other lack of coordination: Secondary | ICD-10-CM | POA: Diagnosis not present

## 2021-12-29 DIAGNOSIS — I69354 Hemiplegia and hemiparesis following cerebral infarction affecting left non-dominant side: Secondary | ICD-10-CM | POA: Diagnosis not present

## 2021-12-30 DIAGNOSIS — R339 Retention of urine, unspecified: Secondary | ICD-10-CM | POA: Diagnosis not present

## 2022-01-02 DIAGNOSIS — I69354 Hemiplegia and hemiparesis following cerebral infarction affecting left non-dominant side: Secondary | ICD-10-CM | POA: Diagnosis not present

## 2022-01-02 DIAGNOSIS — R2689 Other abnormalities of gait and mobility: Secondary | ICD-10-CM | POA: Diagnosis not present

## 2022-01-02 DIAGNOSIS — H6120 Impacted cerumen, unspecified ear: Secondary | ICD-10-CM | POA: Diagnosis not present

## 2022-01-02 DIAGNOSIS — R278 Other lack of coordination: Secondary | ICD-10-CM | POA: Diagnosis not present

## 2022-01-04 DIAGNOSIS — H6120 Impacted cerumen, unspecified ear: Secondary | ICD-10-CM | POA: Diagnosis not present

## 2022-01-04 DIAGNOSIS — R2689 Other abnormalities of gait and mobility: Secondary | ICD-10-CM | POA: Diagnosis not present

## 2022-01-04 DIAGNOSIS — I69354 Hemiplegia and hemiparesis following cerebral infarction affecting left non-dominant side: Secondary | ICD-10-CM | POA: Diagnosis not present

## 2022-01-04 DIAGNOSIS — R278 Other lack of coordination: Secondary | ICD-10-CM | POA: Diagnosis not present

## 2022-01-05 DIAGNOSIS — H903 Sensorineural hearing loss, bilateral: Secondary | ICD-10-CM | POA: Diagnosis not present

## 2022-01-05 DIAGNOSIS — H6123 Impacted cerumen, bilateral: Secondary | ICD-10-CM | POA: Diagnosis not present

## 2022-01-06 DIAGNOSIS — R2689 Other abnormalities of gait and mobility: Secondary | ICD-10-CM | POA: Diagnosis not present

## 2022-01-06 DIAGNOSIS — I69354 Hemiplegia and hemiparesis following cerebral infarction affecting left non-dominant side: Secondary | ICD-10-CM | POA: Diagnosis not present

## 2022-01-06 DIAGNOSIS — R278 Other lack of coordination: Secondary | ICD-10-CM | POA: Diagnosis not present

## 2022-01-09 DIAGNOSIS — R278 Other lack of coordination: Secondary | ICD-10-CM | POA: Diagnosis not present

## 2022-01-09 DIAGNOSIS — R2689 Other abnormalities of gait and mobility: Secondary | ICD-10-CM | POA: Diagnosis not present

## 2022-01-09 DIAGNOSIS — I69354 Hemiplegia and hemiparesis following cerebral infarction affecting left non-dominant side: Secondary | ICD-10-CM | POA: Diagnosis not present

## 2022-01-11 DIAGNOSIS — R2689 Other abnormalities of gait and mobility: Secondary | ICD-10-CM | POA: Diagnosis not present

## 2022-01-11 DIAGNOSIS — I69354 Hemiplegia and hemiparesis following cerebral infarction affecting left non-dominant side: Secondary | ICD-10-CM | POA: Diagnosis not present

## 2022-01-11 DIAGNOSIS — R278 Other lack of coordination: Secondary | ICD-10-CM | POA: Diagnosis not present

## 2022-01-12 DIAGNOSIS — R2689 Other abnormalities of gait and mobility: Secondary | ICD-10-CM | POA: Diagnosis not present

## 2022-01-12 DIAGNOSIS — I69354 Hemiplegia and hemiparesis following cerebral infarction affecting left non-dominant side: Secondary | ICD-10-CM | POA: Diagnosis not present

## 2022-01-12 DIAGNOSIS — R278 Other lack of coordination: Secondary | ICD-10-CM | POA: Diagnosis not present

## 2022-01-13 DIAGNOSIS — I69354 Hemiplegia and hemiparesis following cerebral infarction affecting left non-dominant side: Secondary | ICD-10-CM | POA: Diagnosis not present

## 2022-01-13 DIAGNOSIS — R278 Other lack of coordination: Secondary | ICD-10-CM | POA: Diagnosis not present

## 2022-01-13 DIAGNOSIS — R2689 Other abnormalities of gait and mobility: Secondary | ICD-10-CM | POA: Diagnosis not present

## 2022-01-16 DIAGNOSIS — I69354 Hemiplegia and hemiparesis following cerebral infarction affecting left non-dominant side: Secondary | ICD-10-CM | POA: Diagnosis not present

## 2022-01-16 DIAGNOSIS — R278 Other lack of coordination: Secondary | ICD-10-CM | POA: Diagnosis not present

## 2022-01-16 DIAGNOSIS — R2689 Other abnormalities of gait and mobility: Secondary | ICD-10-CM | POA: Diagnosis not present

## 2022-01-18 DIAGNOSIS — R278 Other lack of coordination: Secondary | ICD-10-CM | POA: Diagnosis not present

## 2022-01-18 DIAGNOSIS — R2689 Other abnormalities of gait and mobility: Secondary | ICD-10-CM | POA: Diagnosis not present

## 2022-01-18 DIAGNOSIS — I69354 Hemiplegia and hemiparesis following cerebral infarction affecting left non-dominant side: Secondary | ICD-10-CM | POA: Diagnosis not present

## 2022-01-20 DIAGNOSIS — I69354 Hemiplegia and hemiparesis following cerebral infarction affecting left non-dominant side: Secondary | ICD-10-CM | POA: Diagnosis not present

## 2022-01-20 DIAGNOSIS — R2689 Other abnormalities of gait and mobility: Secondary | ICD-10-CM | POA: Diagnosis not present

## 2022-01-20 DIAGNOSIS — R278 Other lack of coordination: Secondary | ICD-10-CM | POA: Diagnosis not present

## 2022-01-23 DIAGNOSIS — I69354 Hemiplegia and hemiparesis following cerebral infarction affecting left non-dominant side: Secondary | ICD-10-CM | POA: Diagnosis not present

## 2022-01-23 DIAGNOSIS — R2689 Other abnormalities of gait and mobility: Secondary | ICD-10-CM | POA: Diagnosis not present

## 2022-01-23 DIAGNOSIS — R278 Other lack of coordination: Secondary | ICD-10-CM | POA: Diagnosis not present

## 2022-01-25 DIAGNOSIS — R2689 Other abnormalities of gait and mobility: Secondary | ICD-10-CM | POA: Diagnosis not present

## 2022-01-25 DIAGNOSIS — R278 Other lack of coordination: Secondary | ICD-10-CM | POA: Diagnosis not present

## 2022-01-25 DIAGNOSIS — I69354 Hemiplegia and hemiparesis following cerebral infarction affecting left non-dominant side: Secondary | ICD-10-CM | POA: Diagnosis not present

## 2022-01-30 DIAGNOSIS — I69354 Hemiplegia and hemiparesis following cerebral infarction affecting left non-dominant side: Secondary | ICD-10-CM | POA: Diagnosis not present

## 2022-01-30 DIAGNOSIS — R278 Other lack of coordination: Secondary | ICD-10-CM | POA: Diagnosis not present

## 2022-01-30 DIAGNOSIS — R2689 Other abnormalities of gait and mobility: Secondary | ICD-10-CM | POA: Diagnosis not present

## 2022-01-31 DIAGNOSIS — I69354 Hemiplegia and hemiparesis following cerebral infarction affecting left non-dominant side: Secondary | ICD-10-CM | POA: Diagnosis not present

## 2022-01-31 DIAGNOSIS — R2689 Other abnormalities of gait and mobility: Secondary | ICD-10-CM | POA: Diagnosis not present

## 2022-01-31 DIAGNOSIS — R278 Other lack of coordination: Secondary | ICD-10-CM | POA: Diagnosis not present

## 2022-02-02 DIAGNOSIS — I7091 Generalized atherosclerosis: Secondary | ICD-10-CM | POA: Diagnosis not present

## 2022-02-02 DIAGNOSIS — B351 Tinea unguium: Secondary | ICD-10-CM | POA: Diagnosis not present

## 2022-02-15 DIAGNOSIS — I69359 Hemiplegia and hemiparesis following cerebral infarction affecting unspecified side: Secondary | ICD-10-CM | POA: Diagnosis not present

## 2022-02-15 DIAGNOSIS — I10 Essential (primary) hypertension: Secondary | ICD-10-CM | POA: Diagnosis not present

## 2022-02-15 DIAGNOSIS — D638 Anemia in other chronic diseases classified elsewhere: Secondary | ICD-10-CM | POA: Diagnosis not present

## 2022-02-15 DIAGNOSIS — N139 Obstructive and reflux uropathy, unspecified: Secondary | ICD-10-CM | POA: Diagnosis not present

## 2022-02-15 DIAGNOSIS — E559 Vitamin D deficiency, unspecified: Secondary | ICD-10-CM | POA: Diagnosis not present

## 2022-02-16 DIAGNOSIS — I10 Essential (primary) hypertension: Secondary | ICD-10-CM | POA: Diagnosis not present

## 2022-02-16 DIAGNOSIS — D509 Iron deficiency anemia, unspecified: Secondary | ICD-10-CM | POA: Diagnosis not present

## 2022-02-16 DIAGNOSIS — E538 Deficiency of other specified B group vitamins: Secondary | ICD-10-CM | POA: Diagnosis not present

## 2022-02-16 DIAGNOSIS — E559 Vitamin D deficiency, unspecified: Secondary | ICD-10-CM | POA: Diagnosis not present

## 2022-02-16 DIAGNOSIS — E039 Hypothyroidism, unspecified: Secondary | ICD-10-CM | POA: Diagnosis not present

## 2022-02-24 DIAGNOSIS — R339 Retention of urine, unspecified: Secondary | ICD-10-CM | POA: Diagnosis not present

## 2022-03-24 DIAGNOSIS — R339 Retention of urine, unspecified: Secondary | ICD-10-CM | POA: Diagnosis not present

## 2022-03-24 DIAGNOSIS — N139 Obstructive and reflux uropathy, unspecified: Secondary | ICD-10-CM | POA: Diagnosis not present

## 2022-04-03 DIAGNOSIS — Z435 Encounter for attention to cystostomy: Secondary | ICD-10-CM | POA: Diagnosis not present

## 2022-04-03 DIAGNOSIS — R339 Retention of urine, unspecified: Secondary | ICD-10-CM | POA: Diagnosis not present

## 2022-04-03 DIAGNOSIS — R8271 Bacteriuria: Secondary | ICD-10-CM | POA: Diagnosis not present

## 2022-04-05 DIAGNOSIS — H6123 Impacted cerumen, bilateral: Secondary | ICD-10-CM | POA: Diagnosis not present

## 2022-04-05 DIAGNOSIS — H903 Sensorineural hearing loss, bilateral: Secondary | ICD-10-CM | POA: Diagnosis not present

## 2022-04-05 DIAGNOSIS — Z77122 Contact with and (suspected) exposure to noise: Secondary | ICD-10-CM | POA: Diagnosis not present

## 2022-04-19 DIAGNOSIS — E559 Vitamin D deficiency, unspecified: Secondary | ICD-10-CM | POA: Diagnosis not present

## 2022-04-19 DIAGNOSIS — Z9359 Other cystostomy status: Secondary | ICD-10-CM | POA: Diagnosis not present

## 2022-04-19 DIAGNOSIS — I10 Essential (primary) hypertension: Secondary | ICD-10-CM | POA: Diagnosis not present

## 2022-04-19 DIAGNOSIS — I6782 Cerebral ischemia: Secondary | ICD-10-CM | POA: Diagnosis not present

## 2022-04-19 DIAGNOSIS — D638 Anemia in other chronic diseases classified elsewhere: Secondary | ICD-10-CM | POA: Diagnosis not present

## 2022-04-19 DIAGNOSIS — E039 Hypothyroidism, unspecified: Secondary | ICD-10-CM | POA: Diagnosis not present

## 2022-04-21 DIAGNOSIS — N139 Obstructive and reflux uropathy, unspecified: Secondary | ICD-10-CM | POA: Diagnosis not present

## 2022-04-21 DIAGNOSIS — R339 Retention of urine, unspecified: Secondary | ICD-10-CM | POA: Diagnosis not present

## 2022-05-08 DIAGNOSIS — H905 Unspecified sensorineural hearing loss: Secondary | ICD-10-CM | POA: Diagnosis not present

## 2022-05-19 DIAGNOSIS — R339 Retention of urine, unspecified: Secondary | ICD-10-CM | POA: Diagnosis not present

## 2022-05-19 DIAGNOSIS — N139 Obstructive and reflux uropathy, unspecified: Secondary | ICD-10-CM | POA: Diagnosis not present

## 2022-05-23 ENCOUNTER — Telehealth: Payer: Self-pay

## 2022-05-23 NOTE — Patient Outreach (Signed)
  Care Coordination   Initial Visit Note   05/23/2022 Name: Glenn Mendez MRN: [id number] DOB: August 07, 1932  Glenn Mendez is a 88 y.o. year old male who sees Johny Blamer, MD for primary care. I  spoke with son Kharee Lesesne.  What matters to the patients health and wellness today?  None.  Patient currently residing in nursing home.    Goals Addressed             This Visit's Progress    COMPLETED: Care Coordination Activities- No follow up required       Care Coordination Interventions: Per Son Thomas-Patient residing in nursing home           SDOH assessments and interventions completed:  No     Care Coordination Interventions Activated:  No  Care Coordination Interventions:  No, not indicated   Follow up plan: No further intervention required.   Encounter Outcome:  Pt. Visit Completed   Bary Leriche, RN, MSN [facility] Care Management Care Management Coordinator Direct Line [phone] Toll Free: [phone]  Fax: [phone]

## 2022-05-31 DIAGNOSIS — Z961 Presence of intraocular lens: Secondary | ICD-10-CM | POA: Diagnosis not present

## 2022-05-31 DIAGNOSIS — H2512 Age-related nuclear cataract, left eye: Secondary | ICD-10-CM | POA: Diagnosis not present

## 2022-05-31 DIAGNOSIS — H31103 Choroidal degeneration, unspecified, bilateral: Secondary | ICD-10-CM | POA: Diagnosis not present

## 2022-05-31 DIAGNOSIS — H0100B Unspecified blepharitis left eye, upper and lower eyelids: Secondary | ICD-10-CM | POA: Diagnosis not present

## 2022-06-09 DIAGNOSIS — I10 Essential (primary) hypertension: Secondary | ICD-10-CM | POA: Diagnosis not present

## 2022-06-09 DIAGNOSIS — E039 Hypothyroidism, unspecified: Secondary | ICD-10-CM | POA: Diagnosis not present

## 2022-06-09 DIAGNOSIS — D638 Anemia in other chronic diseases classified elsewhere: Secondary | ICD-10-CM | POA: Diagnosis not present

## 2022-06-09 DIAGNOSIS — Z7982 Long term (current) use of aspirin: Secondary | ICD-10-CM | POA: Diagnosis not present

## 2022-06-09 DIAGNOSIS — E559 Vitamin D deficiency, unspecified: Secondary | ICD-10-CM | POA: Diagnosis not present

## 2022-06-09 DIAGNOSIS — I69354 Hemiplegia and hemiparesis following cerebral infarction affecting left non-dominant side: Secondary | ICD-10-CM | POA: Diagnosis not present

## 2022-06-09 DIAGNOSIS — Z9359 Other cystostomy status: Secondary | ICD-10-CM | POA: Diagnosis not present

## 2022-06-12 DIAGNOSIS — R799 Abnormal finding of blood chemistry, unspecified: Secondary | ICD-10-CM | POA: Diagnosis not present

## 2022-06-16 DIAGNOSIS — N139 Obstructive and reflux uropathy, unspecified: Secondary | ICD-10-CM | POA: Diagnosis not present

## 2022-06-16 DIAGNOSIS — R339 Retention of urine, unspecified: Secondary | ICD-10-CM | POA: Diagnosis not present

## 2022-07-10 DIAGNOSIS — Z23 Encounter for immunization: Secondary | ICD-10-CM | POA: Diagnosis not present

## 2022-07-25 DIAGNOSIS — B351 Tinea unguium: Secondary | ICD-10-CM | POA: Diagnosis not present

## 2022-07-25 DIAGNOSIS — I7091 Generalized atherosclerosis: Secondary | ICD-10-CM | POA: Diagnosis not present

## 2022-09-08 DIAGNOSIS — R339 Retention of urine, unspecified: Secondary | ICD-10-CM | POA: Diagnosis not present

## 2022-09-08 DIAGNOSIS — N139 Obstructive and reflux uropathy, unspecified: Secondary | ICD-10-CM | POA: Diagnosis not present

## 2022-10-06 DIAGNOSIS — N139 Obstructive and reflux uropathy, unspecified: Secondary | ICD-10-CM | POA: Diagnosis not present

## 2022-10-06 DIAGNOSIS — R339 Retention of urine, unspecified: Secondary | ICD-10-CM | POA: Diagnosis not present

## 2022-10-10 DIAGNOSIS — D6489 Other specified anemias: Secondary | ICD-10-CM | POA: Diagnosis not present

## 2022-10-10 DIAGNOSIS — I69354 Hemiplegia and hemiparesis following cerebral infarction affecting left non-dominant side: Secondary | ICD-10-CM | POA: Diagnosis not present

## 2022-10-10 DIAGNOSIS — E039 Hypothyroidism, unspecified: Secondary | ICD-10-CM | POA: Diagnosis not present

## 2022-10-11 DIAGNOSIS — E538 Deficiency of other specified B group vitamins: Secondary | ICD-10-CM | POA: Diagnosis not present

## 2022-10-11 DIAGNOSIS — R799 Abnormal finding of blood chemistry, unspecified: Secondary | ICD-10-CM | POA: Diagnosis not present

## 2022-10-18 DIAGNOSIS — E039 Hypothyroidism, unspecified: Secondary | ICD-10-CM | POA: Diagnosis not present

## 2022-11-01 DIAGNOSIS — E039 Hypothyroidism, unspecified: Secondary | ICD-10-CM | POA: Diagnosis not present

## 2022-11-03 DIAGNOSIS — R339 Retention of urine, unspecified: Secondary | ICD-10-CM | POA: Diagnosis not present

## 2022-11-03 DIAGNOSIS — N139 Obstructive and reflux uropathy, unspecified: Secondary | ICD-10-CM | POA: Diagnosis not present

## 2022-11-09 DIAGNOSIS — I10 Essential (primary) hypertension: Secondary | ICD-10-CM | POA: Diagnosis not present

## 2022-11-09 DIAGNOSIS — E039 Hypothyroidism, unspecified: Secondary | ICD-10-CM | POA: Diagnosis not present

## 2022-12-01 DIAGNOSIS — R339 Retention of urine, unspecified: Secondary | ICD-10-CM | POA: Diagnosis not present

## 2022-12-01 DIAGNOSIS — N139 Obstructive and reflux uropathy, unspecified: Secondary | ICD-10-CM | POA: Diagnosis not present

## 2022-12-07 DIAGNOSIS — D519 Vitamin B12 deficiency anemia, unspecified: Secondary | ICD-10-CM | POA: Diagnosis not present

## 2022-12-21 DIAGNOSIS — R799 Abnormal finding of blood chemistry, unspecified: Secondary | ICD-10-CM | POA: Diagnosis not present

## 2022-12-25 DIAGNOSIS — E039 Hypothyroidism, unspecified: Secondary | ICD-10-CM | POA: Diagnosis not present

## 2022-12-25 DIAGNOSIS — I10 Essential (primary) hypertension: Secondary | ICD-10-CM | POA: Diagnosis not present

## 2022-12-26 DIAGNOSIS — E039 Hypothyroidism, unspecified: Secondary | ICD-10-CM | POA: Diagnosis not present

## 2022-12-29 DIAGNOSIS — N139 Obstructive and reflux uropathy, unspecified: Secondary | ICD-10-CM | POA: Diagnosis not present

## 2022-12-29 DIAGNOSIS — R339 Retention of urine, unspecified: Secondary | ICD-10-CM | POA: Diagnosis not present

## 2023-01-02 DIAGNOSIS — Z23 Encounter for immunization: Secondary | ICD-10-CM | POA: Diagnosis not present

## 2023-01-09 DIAGNOSIS — D638 Anemia in other chronic diseases classified elsewhere: Secondary | ICD-10-CM | POA: Diagnosis not present

## 2023-01-09 DIAGNOSIS — I10 Essential (primary) hypertension: Secondary | ICD-10-CM | POA: Diagnosis not present

## 2023-01-09 DIAGNOSIS — E039 Hypothyroidism, unspecified: Secondary | ICD-10-CM | POA: Diagnosis not present

## 2023-01-09 DIAGNOSIS — E785 Hyperlipidemia, unspecified: Secondary | ICD-10-CM | POA: Diagnosis not present

## 2024-07-12 DEATH — deceased
